# Patient Record
Sex: Male | Born: 2000 | Race: Black or African American | Hispanic: No | Marital: Single | Smoking: Never smoker
Health system: Southern US, Community
[De-identification: ages and names within clinical notes are randomized; demographics above are authoritative.]

## PROBLEM LIST (undated history)

## (undated) DIAGNOSIS — J45909 Unspecified asthma, uncomplicated: Secondary | ICD-10-CM

## (undated) DIAGNOSIS — K56609 Unspecified intestinal obstruction, unspecified as to partial versus complete obstruction: Secondary | ICD-10-CM

## (undated) DIAGNOSIS — S72009A Fracture of unspecified part of neck of unspecified femur, initial encounter for closed fracture: Secondary | ICD-10-CM

## (undated) HISTORY — PX: COLON SURGERY: SHX602

## (undated) HISTORY — PX: APPENDECTOMY: SHX54

## (undated) HISTORY — DX: Unspecified intestinal obstruction, unspecified as to partial versus complete obstruction: K56.609

---
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
## 2019-04-06 ENCOUNTER — Encounter: Payer: Self-pay | Admitting: Emergency Medicine

## 2019-04-06 ENCOUNTER — Ambulatory Visit
Admission: EM | Admit: 2019-04-06 | Discharge: 2019-04-06 | Disposition: A | Discharge status: Home / Self Care | Payer: Medicaid Other | Attending: Family Medicine | Admitting: Family Medicine

## 2019-04-06 ENCOUNTER — Other Ambulatory Visit: Payer: Self-pay

## 2019-04-06 DIAGNOSIS — K529 Noninfective gastroenteritis and colitis, unspecified: Secondary | ICD-10-CM

## 2019-04-06 MED ORDER — ONDANSETRON 8 MG PO TBDP
8.0000 mg | ORAL_TABLET | Freq: Once | ORAL | Status: AC
  Administered 2019-04-06: 8 mg via ORAL

## 2019-04-06 MED ORDER — KETOROLAC TROMETHAMINE 30 MG/ML IJ SOLN
30.0000 mg | Freq: Once | INTRAMUSCULAR | Status: AC
  Administered 2019-04-06: 13:00:00 30 mg via INTRAMUSCULAR

## 2019-04-06 MED ORDER — ONDANSETRON 8 MG PO TBDP: 8.0000 mg | ORAL_TABLET | Freq: Three times a day (TID) | ORAL | 0 refills | Status: DC | PRN

## 2019-04-06 NOTE — ED Provider Notes (Signed)
[facility]    CSN: [id number] Arrival date & time: 04/06/19  1223     History   Chief Complaint Chief Complaint  Patient presents with  . Abdominal Pain  . Emesis    HPI Allen Mays is a 18 y.o. male.   18 yo male with a c/o stomach pain last night then vomiting and diarrhea this morning. Denies any fevers, chills, melena, hematochezia, hematemesis. Thinks it may have been something he ate. No known sick contacts. Has had appendectomy years ago.    Abdominal Pain Associated symptoms: vomiting   Emesis Associated symptoms: abdominal pain     Past Medical History:  Diagnosis Date  . Hip fracture (HCC)     There are no active problems to display for this patient.   Past Surgical History:  Procedure Laterality Date  . APPENDECTOMY    . COLON SURGERY         Home Medications    Prior to Admission medications   Medication Sig Start Date End Date Taking? Authorizing Provider  meloxicam (MOBIC) 15 MG tablet meloxicam 15 mg tablet  TK 1 T PO QD    [provider]  ondansetron (ZOFRAN ODT) 8 MG disintegrating tablet Take 1 tablet (8 mg total) by mouth every 8 (eight) hours as needed. 04/06/19   Payton Mccallumonty, Keiasha Diep, MD  ondansetron (ZOFRAN) 4 MG tablet Take 1 tablet (4 mg total) by mouth every 6 (six) hours as needed for nausea or vomiting. 03/20/18   Georgetta HaberBurky, Natalie B, NP    Family History History reviewed. No pertinent family history.  Social History Social History   Tobacco Use  . Smoking status: Never Smoker  . Smokeless tobacco: Never Used  Substance Use Topics  . Alcohol use: No  . Drug use: No     Allergies   Patient has no known allergies.   Review of Systems Review of Systems  Gastrointestinal: Positive for abdominal pain and vomiting.     Physical Exam Triage Vital Signs ED Triage Vitals  Enc Vitals Group     BP 04/06/19 1246 (!) 150/74     Pulse Rate 04/06/19 1246 75     Resp 04/06/19 1246 16     Temp 04/06/19  1246 97.7 F (36.5 C)     Temp Source 04/06/19 1246 Oral     SpO2 04/06/19 1246 100 %     Weight 04/06/19 1243 165 lb (74.8 kg)     Height 04/06/19 1243 5\' 11"  (1.803 m)     Head Circumference --      Peak Flow --      Pain Score 04/06/19 1243 8     Pain Loc --      Pain Edu? --      Excl. in GC? --    No data found.  Updated Vital Signs BP (!) 150/74 (BP Location: Left Arm)   Pulse 75   Temp 97.7 F (36.5 C) (Oral)   Resp 16   Ht 5\' 11"  (1.803 m)   Wt 74.8 kg   SpO2 100%   BMI 23.01 kg/m   Visual Acuity Right Eye Distance:   Left Eye Distance:   Bilateral Distance:    Right Eye Near:   Left Eye Near:    Bilateral Near:     Physical Exam Vitals signs and nursing note reviewed.  Constitutional:      General: He is not in acute distress.    Appearance: He is not toxic-appearing or diaphoretic.  Abdominal:     General: Bowel sounds are normal. There is no distension.     Palpations: Abdomen is soft. There is no mass.     Tenderness: There is no abdominal tenderness. There is no right CVA tenderness, left CVA tenderness, guarding or rebound.     Hernia: No hernia is present.  Neurological:     Mental Status: He is alert.      UC Treatments / Results  Labs (all labs ordered are listed, but only abnormal results are displayed) Labs Reviewed - No data to display  EKG   Radiology No results found.  Procedures Procedures (including critical care time)  Medications Ordered in UC Medications  ondansetron (ZOFRAN-ODT) disintegrating tablet 8 mg (8 mg Oral Given 04/06/19 1249)  ketorolac (TORADOL) 30 MG/ML injection 30 mg (30 mg Intramuscular Given 04/06/19 1313)    Initial Impression / Assessment and Plan / UC Course  I have reviewed the triage vital signs and the nursing notes.  Pertinent labs & imaging results that were available during my care of the patient were reviewed by me and considered in my medical decision making (see chart for details).       Final Clinical Impressions(s) / UC Diagnoses   Final diagnoses:  Gastroenteritis     Discharge Instructions     Rest, fluids    ED Prescriptions    Medication Sig Dispense Auth. Provider   ondansetron (ZOFRAN ODT) 8 MG disintegrating tablet Take 1 tablet (8 mg total) by mouth every 8 (eight) hours as needed. 6 tablet Norval Gable, MD      1.diagnosis reviewed with patient; patient given 8mg  zofran odt with improvement of symptoms; tolerating po fluids 2. rx as per orders above; reviewed possible side effects, interactions, risks and benefits  3. Recommend supportive treatment as above 4. Follow-up prn if symptoms worsen or don't improve   Controlled Substance Prescriptions [place] Controlled Substance Registry consulted? Not Applicable   Norval Gable, MD 04/06/19 1426

## 2019-04-06 NOTE — ED Triage Notes (Signed)
Patient c/ostomach pain last night.  Patient states that he started vomiting this morning. Patient denies fevers.

## 2019-04-06 NOTE — Discharge Instructions (Addendum)
Rest, fluids. 

## 2020-01-26 ENCOUNTER — Other Ambulatory Visit: Payer: Self-pay

## 2020-01-26 ENCOUNTER — Ambulatory Visit
Admission: EM | Admit: 2020-01-26 | Discharge: 2020-01-26 | Disposition: A | Discharge status: Other Acute Inpatient Hospital | Payer: Medicaid Other | Attending: Family Medicine | Admitting: Family Medicine

## 2020-01-26 ENCOUNTER — Encounter: Payer: Self-pay | Admitting: Emergency Medicine

## 2020-01-26 ENCOUNTER — Ambulatory Visit: Payer: Medicaid Other

## 2020-01-26 DIAGNOSIS — K567 Ileus, unspecified: Secondary | ICD-10-CM | POA: Diagnosis present

## 2020-01-26 HISTORY — DX: Unspecified asthma, uncomplicated: J45.909

## 2020-01-26 LAB — COMPREHENSIVE METABOLIC PANEL
ALT: 25 U/L (ref 0–44)
AST: 32 U/L (ref 15–41)
Albumin: 5 g/dL (ref 3.5–5.0)
Alkaline Phosphatase: 60 U/L (ref 38–126)
Anion gap: 10 (ref 5–15)
BUN: 13 mg/dL (ref 6–20)
CO2: 24 mmol/L (ref 22–32)
Calcium: 9.6 mg/dL (ref 8.9–10.3)
Chloride: 101 mmol/L (ref 98–111)
Creatinine, Ser: 1.12 mg/dL (ref 0.61–1.24)
GFR calc Af Amer: >60 mL/min (ref >60)
GFR calc non Af Amer: >60 mL/min (ref >60)
Glucose, Bld: 118 mg/dL — ABNORMAL HIGH (ref 70–99)
Potassium: 3.9 mmol/L (ref 3.5–5.1)
Sodium: 135 mmol/L (ref 135–145)
Total Bilirubin: 1.3 mg/dL — ABNORMAL HIGH (ref 0.3–1.2)
Total Protein: 8.6 g/dL — ABNORMAL HIGH (ref 6.5–8.1)

## 2020-01-26 LAB — CBC WITH DIFFERENTIAL/PLATELET
Abs Immature Granulocytes: 0.03 10*3/uL (ref 0.00–0.07)
Basophils Absolute: 0 10*3/uL (ref 0.0–0.1)
Basophils Relative: 0 %
Eosinophils Absolute: 0 10*3/uL (ref 0.0–0.5)
Eosinophils Relative: 0 %
HCT: 43.4 % (ref 39.0–52.0)
Hemoglobin: 13.8 g/dL (ref 13.0–17.0)
Immature Granulocytes: 0 %
Lymphocytes Relative: 14 %
Lymphs Abs: 1.1 10*3/uL (ref 0.7–4.0)
MCH: 24 pg — ABNORMAL LOW (ref 26.0–34.0)
MCHC: 31.8 g/dL (ref 30.0–36.0)
MCV: 75.5 fL — ABNORMAL LOW (ref 80.0–100.0)
Monocytes Absolute: 0.6 10*3/uL (ref 0.1–1.0)
Monocytes Relative: 7 %
Neutro Abs: 6.2 10*3/uL (ref 1.7–7.7)
Neutrophils Relative %: 79 %
Platelets: 266 10*3/uL (ref 150–400)
RBC: 5.75 MIL/uL (ref 4.22–5.81)
RDW: 13.2 % (ref 11.5–15.5)
WBC: 8 10*3/uL (ref 4.0–10.5)
nRBC: 0 % (ref 0.0–0.2)

## 2020-01-26 LAB — LIPASE, BLOOD: Lipase: 29 U/L (ref 11–51)

## 2020-01-26 MED ORDER — SODIUM CHLORIDE 0.9 % IV BOLUS
1000.0000 mL | Freq: Once | INTRAVENOUS | Status: AC
  Administered 2020-01-26: 10:00:00 1000 mL via INTRAVENOUS

## 2020-01-26 MED ORDER — KETOROLAC TROMETHAMINE 30 MG/ML IJ SOLN
30.0000 mg | Freq: Once | INTRAMUSCULAR | Status: AC
  Administered 2020-01-26: 10:00:00 30 mg via INTRAVENOUS

## 2020-01-26 MED ORDER — IOHEXOL 300 MG/ML  SOLN
100.0000 mL | Freq: Once | INTRAMUSCULAR | Status: AC | PRN
  Administered 2020-01-26: 11:00:00 100 mL via INTRAVENOUS

## 2020-01-26 MED ORDER — ONDANSETRON HCL 4 MG/2ML IJ SOLN
4.0000 mg | Freq: Once | INTRAMUSCULAR | Status: AC
Start: 2020-01-26 — End: 2020-01-26
  Administered 2020-01-26: 10:00:00 4 mg via INTRAVENOUS

## 2020-01-26 NOTE — ED Provider Notes (Signed)
[facility]    CSN: [id number] Arrival date & time: 01/26/20  0859  History   Chief Complaint Chief Complaint  Patient presents with  . Abdominal Pain   HPI  19 year old male with a prior history of perforated appendicitis and possible Crohn's disease which subsequently led to an ileostomy presents with abdominal pain, nausea, vomiting.  Patient reports that around 8 PM last night he developed severe lower abdominal pain and associated back pain.  Subsequently developed nausea and vomiting.  He has had several episodes of emesis.  Denies fever.  Denies diarrhea.  Patient states that he had a similar bout of the same symptoms approximately 2 weeks ago.  He reports decreased urine output.  Patient believes that he is dehydrated.  Rates his pain is 8/10 in severity.  Located in the lower abdomen and radiates to the back.  No relieving factors.  No other associated symptoms.  No other complaints.  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 Authorizing Provider  Lactobacillus (PROBIOTIC ACIDOPHILUS) CAPS  12/20/19  Yes [provider]    Family History Family History  Problem Relation Age of Onset  . Healthy Mother     Social History Social History   Tobacco Use  . Smoking status: Never Smoker  . Smokeless tobacco: Never Used  Substance Use Topics  . Alcohol use: No  . Drug use: No     Allergies   Patient has no known allergies.   Review of Systems Review of Systems  Constitutional: Positive for appetite change.  Negative for fever.  Gastrointestinal: Positive for abdominal pain, nausea and vomiting.   Physical Exam Triage Vital Signs ED Triage Vitals  Enc Vitals Group     BP 01/26/20 0919 138/65     Pulse Rate 01/26/20 0919 79     Resp 01/26/20 0919 20     Temp 01/26/20 0919 98.2 F (36.8 C)     Temp Source 01/26/20 0919 Oral     SpO2 01/26/20 0919 100 %     Weight 01/26/20 0915 164 lb 14.5 oz (74.8 kg)     Height 01/26/20 0915 5\' 11"  (1.803 m)     Head Circumference --      Peak Flow --      Pain Score 01/26/20 0914 8     Pain Loc --      Pain Edu? --      Excl. in GC? --    Updated Vital Signs BP 138/65 (BP Location: Right Arm)   Pulse 79   Temp 98.2 F (36.8 C) (Oral)   Resp 20   Ht 5\' 11"  (1.803 m)   Wt 74.8 kg   SpO2 100%   BMI 23.00 kg/m   Visual Acuity Right Eye Distance:   Left Eye Distance:   Bilateral Distance:    Right Eye Near:   Left Eye Near:    Bilateral Near:     Physical Exam Vitals and nursing note reviewed.  Constitutional:      General: He is not in acute distress.    Appearance:  He is well-developed. He is not ill-appearing.  HENT:     Head: Normocephalic and atraumatic.  Eyes:     General:        Right eye: No discharge.        Left eye: No discharge.     Conjunctiva/sclera: Conjunctivae normal.  Cardiovascular:     Rate and Rhythm: Normal rate and regular rhythm.     Heart sounds: No murmur.  Pulmonary:     Effort: Pulmonary effort is normal.     Breath sounds: Normal breath sounds. No wheezing or rales.  Abdominal:     Comments: Soft, nondistended.  No discrete tenderness on exam.  Neurological:     Mental Status: He is alert.  Psychiatric:        Mood and Affect: Mood normal.        Behavior: Behavior normal.    UC Treatments / Results  Labs (all labs ordered are listed, but only abnormal results are displayed) Labs Reviewed  CBC WITH DIFFERENTIAL/PLATELET - Abnormal; Notable for the following components:      Result Value     MCV 75.5 (*)    MCH 24.0 (*)    All other components within normal limits  COMPREHENSIVE METABOLIC PANEL  LIPASE, BLOOD    EKG   Radiology CT ABDOMEN PELVIS W CONTRAST  Result Date: 01/26/2020 CLINICAL DATA:  Abdominal pain with vomiting EXAM: CT ABDOMEN AND PELVIS WITH CONTRAST TECHNIQUE: Multidetector CT imaging of the abdomen and pelvis was performed using the standard protocol following bolus administration of intravenous contrast. CONTRAST:  198mL OMNIPAQUE IOHEXOL 300 MG/ML  SOLN COMPARISON:  None. FINDINGS: Lower chest: On axial slice 1 series 6, there is a 2 mm nodular opacity in the periphery of the lateral segment of the right lower lobe. Lung bases otherwise are clear. Hepatobiliary: No focal liver lesions are appreciable. Gallbladder wall is not appreciably thickened. There is no biliary duct dilatation. Pancreas: No pancreatic mass or inflammatory focus. Spleen: No splenic lesions are evident. Adrenals/Urinary Tract: Adrenals bilaterally appear normal. Kidneys bilaterally show no evident mass or hydronephrosis on either side. Urinary bladder is midline with wall thickness within normal limits. Stomach/Bowel: There are postoperative changes in the right lateral abdomen in the region of the proximal ascending colon. Anastomosis in this area is patent. Much of the colon is mildly dilated with apparent liquid stool throughout most of the colon. There is no appreciable colonic wall thickening or focal area of colonic obstruction. There is no appreciable small bowel wall thickening or evidence of small-bowel obstruction. No free air or portal venous air evident. Vascular/Lymphatic: No abdominal aortic aneurysm. No arterial vascular lesions are evident. Major venous structures appear patent. There is no evident adenopathy in the abdomen or pelvis. Reproductive: Prostate and seminal vesicles are normal in size and contour. There is no evident pelvic mass. Other: Appendix absent. No  periappendiceal region inflammation. There is no ascites or abscess in the abdomen or pelvis. Musculoskeletal: No blastic or lytic bone lesions. Evidence of old trauma along the left iliac crest with a focal area of nonunion. This area is well corticated. No acute fracture evident. No intramuscular or abdominal wall lesions. IMPRESSION: 1. Postoperative change involving the right colon with patent anastomosis. Appendix absent. There is mild dilatation of much of the transverse, descending, and sigmoid colon regions with a fairly large amount of predominantly liquid stool throughout this area. Question underlying diarrhea. No bowel obstruction or bowel wall thickening. A degree of colonic ileus  is questioned. 2. No evident small bowel obstruction. No small bowel wall thickening appreciable. No evident abscess in the abdomen or pelvis. 3. No renal or ureteral calculus. No hydronephrosis. Urinary bladder wall thickness within normal limits. 4. 2 mm nodular opacity in the right lower lobe. In this age group, this finding is almost certainly benign. Unless this patient has a history of neoplasm, further imaging evaluation of this small nodular opacity is not felt to be warranted. Electronically Signed   By: Bretta Bang III M.D.   On: 01/26/2020 11:16    Procedures Procedures (including critical care time)  Medications Ordered in UC Medications  sodium chloride 0.9 % bolus 1,000 mL (0 mLs Intravenous Stopped 01/26/20 1142)  ondansetron (ZOFRAN) injection 4 mg (4 mg Intravenous Given 01/26/20 0948)  ketorolac (TORADOL) 30 MG/ML injection 30 mg (30 mg Intravenous Given 01/26/20 1020)  iohexol (OMNIPAQUE) 300 MG/ML solution 100 mL (100 mLs Intravenous Contrast Given 01/26/20 1105)    Initial Impression / Assessment and Plan / UC Course  I have reviewed the triage vital signs and the nursing notes.  Pertinent labs & imaging results that were available during my care of the patient were reviewed by me and  considered in my medical decision making (see chart for details).    19 year old male presents with lower abdominal pain, back pain, nausea, and vomiting.  Labs reassuring.  Given his history and presentation, CT abdomen pelvis was obtained.  See findings above.  Concern for ileus.  Patient was given IV fluids and IV Zofran as well as IV Toradol with improvement here.  I have advised the patient and his mother that he needs to go to the hospital for serial monitoring and observation.  They are in agreement.  He is going to [facility].  I have notified the triage nurse at [facility].  Final Clinical Impressions(s) / UC Diagnoses   Final diagnoses:  Ileus [facility])     Discharge Instructions     Go directly to the ER [facility]).  Take care & best of luck  Dr. Adriana Simas     ED Prescriptions    None     PDMP not reviewed this encounter.   Tommie Sams, [place] 01/26/20 1152

## 2020-01-26 NOTE — Discharge Instructions (Addendum)
Go directly to the ER [facility]).  Take care & best of luck  Dr. Adriana Simas

## 2020-01-26 NOTE — ED Triage Notes (Addendum)
Pt c/o mid lower abdominal pain, vomiting, lower back pain. Started yesterday. Denies fever. States he diarrhea yesterday morning but that is not abnormal for him. He states that he has not urinated since last night. He states he is not keeping fluids down. Declines covid testing, states he is tested every week.

## 2020-01-27 DIAGNOSIS — Z9889 Other specified postprocedural states: Secondary | ICD-10-CM | POA: Insufficient documentation

## 2020-01-27 DIAGNOSIS — K56609 Unspecified intestinal obstruction, unspecified as to partial versus complete obstruction: Secondary | ICD-10-CM | POA: Insufficient documentation

## 2020-03-18 ENCOUNTER — Other Ambulatory Visit: Payer: Self-pay

## 2020-03-18 ENCOUNTER — Ambulatory Visit
Admission: EM | Admit: 2020-03-18 | Discharge: 2020-03-18 | Disposition: A | Discharge status: Home / Self Care | Payer: Medicaid Other | Attending: Emergency Medicine | Admitting: Emergency Medicine

## 2020-03-18 ENCOUNTER — Encounter: Payer: Self-pay | Admitting: Emergency Medicine

## 2020-03-18 DIAGNOSIS — Z202 Contact with and (suspected) exposure to infections with a predominantly sexual mode of transmission: Secondary | ICD-10-CM | POA: Diagnosis present

## 2020-03-18 LAB — CHLAMYDIA/NGC RT PCR (ARMC ONLY)
Chlamydia Tr: DETECTED — AB
N gonorrhoeae: NOT DETECTED

## 2020-03-18 MED ORDER — DOXYCYCLINE HYCLATE 100 MG PO CAPS: 100.0000 mg | ORAL_CAPSULE | Freq: Two times a day (BID) | ORAL | 0 refills | Status: AC

## 2020-03-18 MED ORDER — CEFTRIAXONE SODIUM 500 MG IJ SOLR
500.0000 mg | Freq: Once | INTRAMUSCULAR | Status: AC
  Administered 2020-03-18: 11:00:00 500 mg via INTRAMUSCULAR

## 2020-03-18 NOTE — ED Triage Notes (Signed)
Patient received a call from a partner yesterday stating they tested positive for Chlamydia. Patient here today to get STD testing. Patient denies any symptoms at this time.

## 2020-03-18 NOTE — Discharge Instructions (Addendum)
You were seen for possible STD exposure and are being treated for possible gonorrhea and chlamydia.   Take the antibiotics as prescribed until they're finished. If you think you're having a reaction, stop the medication, take benadryl and go to the nearest urgent care/emergency room. Take a probiotic while taking the antibiotic to decrease the chances of stomach upset.   Make sure you either 1) don't have sex for a week or 2) use a condom with sex for at least a week. Condoms with sex is always recommended.  Full screening for STIs at the health department strongly encouraged.  Take care, Dr. Sharlet Salina, NP-c

## 2020-03-18 NOTE — ED Provider Notes (Signed)
[facility] [place] [facility] [place]   Name: Allen Mays DOB: Oct 23, 2000 MRN: [id number] CSN: [id number] PCP: System, Provider Not In  Arrival date and time:  03/18/20 0935  Chief Complaint:  Exposure to STD   NOTE: Prior to seeing the patient today, I have reviewed the triage nursing documentation and vital signs. Clinical staff has updated patient's PMH/PSHx, current medication list, and drug allergies/intolerances to ensure comprehensive history available to assist in medical decision making.   History:   HPI: Allen Mays is a 19 y.o. male who presents today with complaints of possible STI exposure. Patient states she was positive for chlamydia yesterday.  Last time he had unprotected sexual intercourse with this partner was 2 days ago. He has never been tested or treated for STIs in the past.  He currently denies any symptoms, but he is wanting to get tested and received treatment due to his partners positive chlamydia test.   Past Medical History:  Diagnosis Date   Asthma    Hip fracture (HCC)     Past Surgical History:  Procedure Laterality Date   APPENDECTOMY     COLON SURGERY      Family History  Problem Relation Age of Onset   Healthy Mother    Other Father        unknown medical history    Social History   Tobacco Use   Smoking status: Never Smoker   Smokeless tobacco: Never Used  Vaping Use   Vaping Use: Never used  Substance Use Topics   Alcohol use: No   Drug use: No    There are no problems to display for this patient.   Home Medications:    No outpatient medications have been marked as taking for the 03/18/20 encounter [facility] Encounter).    Allergies:   Justicia adhatoda (malabar nut tree) [justicia adhatoda], Eggs or egg-derived products, Lac bovis, and Lactase  Review of Systems (ROS): Review of Systems  Genitourinary: Negative for dysuria, penile pain, penile swelling, scrotal swelling and testicular pain.  All other  systems reviewed and are negative.    Vital Signs: Today's Vitals   03/18/20 0952 03/18/20 0958 03/18/20 1047  BP: 122/74    Pulse: 73    Resp: 16    Temp: 98.8 F (37.1 C)    TempSrc: Oral    SpO2: 99%    Weight:  166 lb (75.3 kg)   Height:  5\' 11"  (1.803 m)   PainSc:  0-No pain 0-No pain    Physical Exam: Physical Exam Vitals and nursing note reviewed.  Constitutional:      Appearance: Normal appearance.  Cardiovascular:     Rate and Rhythm: Normal rate and regular rhythm.     Pulses: Normal pulses.     Heart sounds: Normal heart sounds.  Pulmonary:     Effort: Pulmonary effort is normal.     Breath sounds: Normal breath sounds.  Skin:    General: Skin is warm and dry.  Neurological:     General: No focal deficit present.     Mental Status: He is alert and oriented to person, place, and time.  Psychiatric:        Mood and Affect: Mood normal.        Behavior: Behavior normal.      Urgent Care Treatments / Results:   LABS: PLEASE NOTE: all labs that were ordered this encounter are listed, however only abnormal results are displayed. Labs Reviewed  CHLAMYDIA/NGC RT PCR (  [facility] ONLY)    EKG: -None  RADIOLOGY: No results found.  PROCEDURES: Procedures  MEDICATIONS RECEIVED THIS VISIT: Medications  cefTRIAXone (ROCEPHIN) injection 500 mg (500 mg Intramuscular Given 03/18/20 1044)    PERTINENT CLINICAL COURSE NOTES/UPDATES:   Initial Impression / Assessment and Plan / Urgent Care Course:  Pertinent labs & imaging results that were available during my care of the patient were personally reviewed by me and considered in my medical decision making (see lab/imaging section of note for values and interpretations).  Allen Mays is a 19 y.o. male who presents to [facility] today with complaints of STI exposure, diagnosed with the same, and treated as such with the medications below. NP and patient reviewed discharge instructions below during  visit.   Patient is well appearing overall in clinic today. He does not appear to be in any acute distress. Presenting symptoms (see HPI) and exam as documented above.   I have reviewed the follow up and strict return precautions for any new or worsening symptoms. Patient is aware of symptoms that would be deemed urgent/emergent, and would thus require further evaluation either here or in the emergency department. At the time of discharge, he verbalized understanding and consent with the discharge plan as it was reviewed with him. All questions were fielded by provider and/or clinic staff prior to patient discharge.    Final Clinical Impressions / Urgent Care Diagnoses:   Final diagnoses:  STD exposure    New Prescriptions:  [place] Controlled Substance Registry consulted? Not Applicable  Meds ordered this encounter  Medications   cefTRIAXone (ROCEPHIN) injection 500 mg   doxycycline (VIBRAMYCIN) 100 MG capsule    Sig: Take 1 capsule (100 mg total) by mouth 2 (two) times daily for 7 days.    Dispense:  14 capsule    Refill:  0      Discharge Instructions     You were seen for possible STD exposure and are being treated for possible gonorrhea and chlamydia.   Take the antibiotics as prescribed until they're finished. If you think you're having a reaction, stop the medication, take benadryl and go to the nearest urgent care/emergency room. Take a probiotic while taking the antibiotic to decrease the chances of stomach upset.   Make sure you either 1) don't have sex for a week or 2) use a condom with sex for at least a week. Condoms with sex is always recommended.  Full screening for STIs at the health department strongly encouraged.  Take care, Dr. Marland Kitchen, NP-c     Recommended Follow up Care:  Patient encouraged to follow up with the following provider within the specified time frame, or sooner as dictated by the severity of his symptoms. As always, he was instructed that for  any urgent/emergent care needs, he should seek care either here or in the emergency department for more immediate evaluation.   Gertie Baron, DNP, NP-c    Gertie Baron, NP 03/18/20 1102

## 2020-03-28 ENCOUNTER — Encounter: Payer: Self-pay | Admitting: Emergency Medicine

## 2020-03-28 ENCOUNTER — Emergency Department: Payer: Medicaid Other

## 2020-03-28 ENCOUNTER — Other Ambulatory Visit: Payer: Self-pay

## 2020-03-28 ENCOUNTER — Ambulatory Visit
Admission: EM | Admit: 2020-03-28 | Discharge: 2020-03-28 | Disposition: A | Discharge status: Home / Self Care | Payer: Medicaid Other | Attending: Nurse Practitioner | Admitting: Nurse Practitioner

## 2020-03-28 DIAGNOSIS — R12 Heartburn: Secondary | ICD-10-CM

## 2020-03-28 DIAGNOSIS — R9431 Abnormal electrocardiogram [ECG] [EKG]: Secondary | ICD-10-CM | POA: Diagnosis not present

## 2020-03-28 DIAGNOSIS — J45909 Unspecified asthma, uncomplicated: Secondary | ICD-10-CM | POA: Diagnosis not present

## 2020-03-28 DIAGNOSIS — R0789 Other chest pain: Secondary | ICD-10-CM | POA: Insufficient documentation

## 2020-03-28 LAB — BASIC METABOLIC PANEL
Anion gap: 10 (ref 5–15)
BUN: 14 mg/dL (ref 6–20)
CO2: 28 mmol/L (ref 22–32)
Calcium: 9.4 mg/dL (ref 8.9–10.3)
Chloride: 101 mmol/L (ref 98–111)
Creatinine, Ser: 1.09 mg/dL (ref 0.61–1.24)
GFR calc Af Amer: >60 mL/min (ref >60)
GFR calc non Af Amer: >60 mL/min (ref >60)
Glucose, Bld: 85 mg/dL (ref 70–99)
Potassium: 3.9 mmol/L (ref 3.5–5.1)
Sodium: 139 mmol/L (ref 135–145)

## 2020-03-28 LAB — CBC
HCT: 42.1 % (ref 39.0–52.0)
Hemoglobin: 13.6 g/dL (ref 13.0–17.0)
MCH: 24 pg — ABNORMAL LOW (ref 26.0–34.0)
MCHC: 32.3 g/dL (ref 30.0–36.0)
MCV: 74.4 fL — ABNORMAL LOW (ref 80.0–100.0)
Platelets: 190 10*3/uL (ref 150–400)
RBC: 5.66 MIL/uL (ref 4.22–5.81)
RDW: 14.2 % (ref 11.5–15.5)
WBC: 5.3 10*3/uL (ref 4.0–10.5)
nRBC: 0 % (ref 0.0–0.2)

## 2020-03-28 LAB — TROPONIN I (HIGH SENSITIVITY): Troponin I (High Sensitivity): 3 ng/L (ref <18)

## 2020-03-28 MED ORDER — ASPIRIN 81 MG PO CHEW
324.0000 mg | CHEWABLE_TABLET | Freq: Once | ORAL | Status: AC
  Administered 2020-03-28: 20:00:00 324 mg via ORAL

## 2020-03-28 NOTE — ED Provider Notes (Signed)
[facility]    CSN: [id number] Arrival date & time: 03/28/20  1713      History   Chief Complaint Chief Complaint  Patient presents with  . Heartburn    HPI Allen Mays is a 19 y.o. male.   Subjective:   Allen Mays is an 19 y.o. male who presents for evaluation of heartburn. This has been associated with belching. Symptoms primarily relate to meals and lying down after meals but has occurred during fasting states. He denies any abdominal bloating, bilious reflux, chest pain, choking on food, cough, difficulty swallowing, dysphagia, early satiety, fullness after meals, hematemesis, hoarseness, melena, midespigastric pain, nausea, need to clear throat frequently, regurgitation of undigested food, shortness of breath, unexpected weight loss, upper abdominal discomfort or wheezing. Symptoms have been present for 3 days. He denies dysphagia. He has not lost weight. He denies melena, hematochezia, hematemesis, and coffee ground emesis.  He has tried Pepto-Bismol and another reflux medication given by a friend with no improvement in his symptoms. He has no prior history of heartburn. He denies any known heart disease in self or family.   The following portions of the patient's history were reviewed and updated as appropriate: allergies, current medications, past family history, past medical history, past social history, past surgical history and problem list.       Past Medical History:  Diagnosis Date  . Asthma   . Hip fracture (HCC)     There are no problems to display for this patient.   Past Surgical History:  Procedure Laterality Date  . APPENDECTOMY    . COLON SURGERY         Home Medications    Prior to Admission medications   Medication Sig Start Date End Date Taking? Authorizing Provider  Lactobacillus (PROBIOTIC ACIDOPHILUS) CAPS  12/20/19   [provider]    Family History Family History  Problem Relation Age of Onset  . Healthy  Mother   . Other Father        unknown medical history    Social History Social History   Tobacco Use  . Smoking status: Never Smoker  . Smokeless tobacco: Never Used  Vaping Use  . Vaping Use: Never used  Substance Use Topics  . Alcohol use: No  . Drug use: No     Allergies   Justicia adhatoda (malabar nut tree) [justicia adhatoda], Eggs or egg-derived products, Lac bovis, and Lactase   Review of Systems Review of Systems  Constitutional: Negative for appetite change.  Respiratory: Negative for cough, choking, shortness of breath and wheezing.   Cardiovascular: Negative for chest pain.  Gastrointestinal: Negative for abdominal distention, abdominal pain, blood in stool, constipation, diarrhea, nausea and vomiting.  All other systems reviewed and are negative.    Physical Exam Triage Vital Signs ED Triage Vitals  Enc Vitals Group     BP 03/28/20 1749 127/62     Pulse Rate 03/28/20 1749 60     Resp 03/28/20 1749 18     Temp 03/28/20 1749 98.4 F (36.9 C)     Temp Source 03/28/20 1749 Oral     SpO2 03/28/20 1749 100 %     Weight 03/28/20 1748 165 lb (74.8 kg)     Height 03/28/20 1748 5\' 11"  (1.803 m)     Head Circumference --      Peak Flow --      Pain Score 03/28/20 1747 3     Pain Loc --  Pain Edu? --      Excl. in GC? --    No data found.  Updated Vital Signs BP 127/62 (BP Location: Left Arm)   Pulse 60   Temp 98.4 F (36.9 C) (Oral)   Resp 18   Ht 5\' 11"  (1.803 m)   Wt 165 lb (74.8 kg)   SpO2 100%   BMI 23.01 kg/m   Visual Acuity Right Eye Distance:   Left Eye Distance:   Bilateral Distance:    Right Eye Near:   Left Eye Near:    Bilateral Near:     Physical Exam Vitals and nursing note reviewed.  Constitutional:      General: He is not in acute distress.    Appearance: Normal appearance. He is not ill-appearing, toxic-appearing or diaphoretic.  HENT:     Head: Normocephalic.     Mouth/Throat:     Lips: Pink.     Mouth:  Mucous membranes are moist.     Pharynx: Oropharynx is clear. No pharyngeal swelling or posterior oropharyngeal erythema.  Cardiovascular:     Rate and Rhythm: Normal rate and regular rhythm.     Heart sounds: Murmur heard.   Pulmonary:     Effort: Pulmonary effort is normal.     Breath sounds: Normal breath sounds.  Abdominal:     General: There is no distension.     Palpations: Abdomen is soft.     Tenderness: There is no abdominal tenderness.  Musculoskeletal:        General: Normal range of motion.     Cervical back: Normal range of motion and neck supple.  Skin:    General: Skin is warm and dry.  Neurological:     General: No focal deficit present.     Mental Status: He is alert and oriented to person, place, and time.      UC Treatments / Results  Labs (all labs ordered are listed, but only abnormal results are displayed) Labs Reviewed - No data to display  EKG   Radiology No results found.  Procedures ED EKG  Date/Time: 03/28/2020 7:35 PM Performed by: 05/29/2020, FNP Authorized by: Lurline Idol, FNP   ECG reviewed by ED Physician in the absence of a cardiologist: yes   Previous ECG:    Previous ECG:  Unavailable Interpretation:    Interpretation: abnormal   Rate:    ECG rate:  53   ECG rate assessment: bradycardic   Rhythm:    Rhythm: sinus bradycardia   ST segments:    ST segments:  Abnormal   Elevation:  V2, V3, V4, V5 and V6   (including critical care time)  Medications Ordered in UC Medications  aspirin chewable tablet 324 mg (324 mg Oral Given 03/28/20 1931)    Initial Impression / Assessment and Plan / UC Course  I have reviewed the triage vital signs and the nursing notes.  Pertinent labs & imaging results that were available during my care of the patient were reviewed by me and considered in my medical decision making (see chart for details).    19 year old male with history of asthma presenting with a 3-day history of  possible heartburn.  Patient has tried to couple of over-the-counter agents for his symptoms without any relief in his symptoms.  He denies any chest pain, shortness of breath or wheezing.  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  He is very athletic and in shape.  EKG shows sinus  bradycardia with ST elevations in V2, V3, V4, V5 and V6.  No acute STEMI appreciated; however, this EKG is grossly abnormal.  Patient was given aspirin 324 mg p.o. EMS has been called for emergent ED evaluation.  Patient will definitely need cardiology consultation as well.  Patient is alert and oriented x3.  Afebrile.  Vital signs stable.  Nontoxic-appearing.  Discussed diagnosis with patient as well as his mother over the telephone.  Both patient and his mother is comfortable with the plan and disposition.  Copy of today's note as well as EKG given to EMS.  Report given to EMS at their arrival to the clinic.  This care was provided during an unprecedented National Emergency due to the Novel Coronavirus (COVID-19) pandemic. COVID-19 infections and transmission risks place heavy strains on healthcare resources.  As this pandemic evolves, our facility, providers, and staff strive to respond fluidly, to remain operational, and to provide care relative to available resources and information. Outcomes are unpredictable and treatments are without well-defined guidelines. Further, the impact of COVID-19 on all aspects of urgent care, including the impact to patients seeking care for reasons other than COVID-19, is unavoidable during this national emergency. At this time of the global pandemic, management of patients has significantly changed, even for non-COVID positive patients given high local and regional COVID volumes at this time requiring high healthcare system and resource utilization. The standard of care for management of both COVID suspected and non-COVID suspected patients continues to change rapidly at the local,  regional, national, and global levels. This patient was worked up and treated to the best available but ever changing evidence and resources available at this current time.   Documentation was completed with the aid of voice recognition software. Transcription may contain typographical errors.  Final Clinical Impressions(s) / UC Diagnoses   Final diagnoses:  Nonspecific abnormal electrocardiogram (ECG) (EKG)     Discharge Instructions     You are evaluated today for possible heartburn; however, in your EKG is very abnormal.  Considering that you are very healthy and athletic with no medical problems, you need urgent urgency room evaluation cardiology consultation.  You have been given aspirin and you will be transported to the emergency department via ambulance.  Have spoken to your mother over the phone per your request and updated her on your condition.  Please let her know his hospital you will be transported to.    ED Prescriptions    None     PDMP not reviewed this encounter.   Lurline Idol, [place] 03/28/20 1939

## 2020-03-28 NOTE — ED Notes (Signed)
Patient is being discharged from the Urgent Care and sent to the Emergency Department via EMS . Per Lelon Mast, NP, patient is in need of higher level of care due to chest pain and abnormal EKG. Patient is aware and verbalizes understanding of plan of care.  Vitals:   03/28/20 1749  BP: 127/62  Pulse: 60  Resp: 18  Temp: 98.4 F (36.9 C)  SpO2: 100%  \

## 2020-03-28 NOTE — ED Triage Notes (Signed)
Patient states that he has been having heart burn since Saturday and has been unable to get any relief from the Pepto-Bismol. States that it worsens after he eats and when he wakes up in the morning.

## 2020-03-28 NOTE — ED Triage Notes (Signed)
Pt arrived via EMS from UC where he was being evaluated for heart burn symptoms since Saturday. Pt sent to ED due to abnormal EKG reading. Pt denies SOB and chest pain but reports burning after eating.

## 2020-03-28 NOTE — Discharge Instructions (Signed)
You are evaluated today for possible heartburn; however, in your EKG is very abnormal.  Considering that you are very healthy and athletic with no medical problems, you need urgent urgency room evaluation cardiology consultation.  You have been given aspirin and you will be transported to the emergency department via ambulance.  Have spoken to your mother over the phone per your request and updated her on your condition.  Please let her know his hospital you will be transported to.

## 2020-03-29 ENCOUNTER — Emergency Department
Admission: EM | Admit: 2020-03-29 | Discharge: 2020-03-29 | Disposition: A | Discharge status: Home / Self Care | Payer: Medicaid Other | Attending: Emergency Medicine | Admitting: Emergency Medicine

## 2020-03-29 DIAGNOSIS — R0789 Other chest pain: Secondary | ICD-10-CM

## 2020-03-29 MED ORDER — ALUM & MAG HYDROXIDE-SIMETH 200-200-20 MG/5ML PO SUSP: 30.0000 mL | Freq: Once | ORAL | Status: DC

## 2020-03-29 MED ORDER — LIDOCAINE VISCOUS HCL 2 % MT SOLN: 15.0000 mL | Freq: Once | OROMUCOSAL | Status: DC

## 2020-03-29 NOTE — Discharge Instructions (Signed)
I suspect your chest pain is from acid reflux. Take the following OVER THE COUNTER meds:  -- Omeprazole once daily in the mornings for 14 days -- Pepcid twice a day as needed  YOUR EKG TODAY SHOWED BENIGN REPOLARIZATION, A COMMON FINDING. THAT BEING SAID, I'D RECOMMEND REPEATING AN EKG WITH YOUR ATHLETIC TRAINER PRIOR TO RETURNING TO FULL PRACTICE/CARDIO PRACTICE, FOR CLEARANCE

## 2020-03-29 NOTE — ED Provider Notes (Signed)
[facility] Emergency Department Provider Note  ____________________________________________   First MD Initiated Contact with Patient 03/29/20 0023     (approximate)  I have reviewed the triage vital signs and the nursing notes.   HISTORY  Chief Complaint Chest Pain    HPI Allen Mays is a 19 y.o. male  Here with atypical chest pain. Pt reports that over the past week, he's had increasing feeling of burning type discomfort in his substernal area, radiating up towards his throat. THis is often associated with eating and has improved with GI meds. He states he has had some worsening w/ lying flat as well. No SOB, lightheadedness, or dizziness. Pt went to UC to be evaluated today and was sent here for possible abnl EKG. He has never been told he has an abnormal EKG in the past. Denies any known family h/o SCD or early heart disease. 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        Past Medical History:  Diagnosis Date  . Asthma   . Hip fracture (HCC)     There are no problems to display for this patient.   Past Surgical History:  Procedure Laterality Date  . APPENDECTOMY    . COLON SURGERY      Prior to Admission medications   Medication Sig Start Date End Date Taking? Authorizing Provider  Lactobacillus (PROBIOTIC ACIDOPHILUS) CAPS  12/20/19   [provider]    Allergies Justicia adhatoda (malabar nut tree) [justicia adhatoda], Eggs or egg-derived products, Lac bovis, and Lactase  Family History  Problem Relation Age of Onset  . Healthy Mother   . Other Father        unknown medical history    Social History Social History   Tobacco Use  . Smoking status: Never Smoker  . Smokeless tobacco: Never Used  Vaping Use  . Vaping Use: Never used  Substance Use Topics  . Alcohol use: No  . Drug use: No    Review of Systems  Review of Systems  Constitutional: Negative for chills,  fatigue and fever.  HENT: Negative for sore throat.   Respiratory: Negative for shortness of breath.   Cardiovascular: Positive for chest pain.  Gastrointestinal: Positive for nausea. Negative for abdominal pain.  Genitourinary: Negative for flank pain.  Musculoskeletal: Negative for neck pain.  Skin: Negative for rash and wound.  Allergic/Immunologic: Negative for immunocompromised state.  Neurological: Negative for weakness and numbness.  Hematological: Does not bruise/bleed easily.  All other systems reviewed and are negative.    ____________________________________________  PHYSICAL EXAM:      VITAL SIGNS: ED Triage Vitals  Enc Vitals Group     BP 03/28/20 2028 140/80     Pulse Rate 03/28/20 2028 (!) 54     Resp 03/28/20 2028 18     Temp 03/28/20 2028 98 F (36.7 C)     Temp Source 03/28/20 2028 Oral     SpO2 03/28/20 2028 100 %     Weight 03/28/20 2028 165 lb (74.8 kg)     Height 03/28/20 2028 5\' 11"  (1.803 m)     Head Circumference --      Peak Flow --      Pain Score 03/29/20 0001 0     Pain Loc --      Pain Edu? --      Excl. in GC? --      Physical Exam Vitals and  nursing note reviewed.  Constitutional:      General: He is not in acute distress.    Appearance: He is well-developed.  HENT:     Head: Normocephalic and atraumatic.  Eyes:     Conjunctiva/sclera: Conjunctivae normal.  Cardiovascular:     Rate and Rhythm: Normal rate and regular rhythm.     Heart sounds: Normal heart sounds.  Pulmonary:     Effort: Pulmonary effort is normal. No respiratory distress.     Breath sounds: No wheezing.  Abdominal:     General: There is no distension.  Musculoskeletal:     Cervical back: Neck supple.  Skin:    General: Skin is warm.     Capillary Refill: Capillary refill takes less than 2 seconds.     Findings: No rash.  Neurological:     Mental Status: He is alert and oriented to person, place, and time.     Motor: No abnormal muscle tone.        ____________________________________________   LABS (all labs ordered are listed, but only abnormal results are displayed)  Labs Reviewed  CBC - Abnormal; Notable for the following components:      Result Value   MCV 74.4 (*)    MCH 24.0 (*)    All other components within normal limits  BASIC METABOLIC PANEL  TROPONIN I (HIGH SENSITIVITY)    ____________________________________________  EKG: Normal sinus rhythm, VR 56. PR 128, QRS 94, QTc 443. BER with J point elevation most notable in precordial leads, with likely physiologic u waves noted. No reciprocal changes.  ________________________________________  RADIOLOGY All imaging, including plain films, CT scans, and ultrasounds, independently reviewed by me, and interpretations confirmed via formal radiology reads.  ED MD interpretation:   CXR: Clear, no cardiomegaly  Official radiology report(s): DG Chest 2 View  Result Date: 03/28/2020 CLINICAL DATA:  Epigastric pain. EXAM: CHEST - 2 VIEW COMPARISON:  None. FINDINGS: The heart size and mediastinal contours are within normal limits. Both lungs are clear. The visualized skeletal structures are unremarkable. IMPRESSION: No active cardiopulmonary disease. Electronically Signed   By: Aram Candela M.D.   On: 03/28/2020 20:55    ____________________________________________  PROCEDURES   Procedure(s) performed (including Critical Care):  Procedures  ____________________________________________  INITIAL IMPRESSION / MDM / ASSESSMENT AND PLAN / ED COURSE  As part of my medical decision making, I reviewed the following data within the electronic MEDICAL RECORD NUMBER Nursing notes reviewed and incorporated, Old chart reviewed, Notes from prior ED visits, and [place] Controlled Substance Database       *Allen Mays was evaluated in Emergency Department on 03/29/2020 for the symptoms described in the history of present illness. He was evaluated in the context of the global  COVID-19 pandemic, which necessitated consideration that the patient might be at risk for infection with the SARS-CoV-2 virus that causes COVID-19. Institutional protocols and algorithms that pertain to the evaluation of patients at risk for COVID-19 are in a state of rapid change based on information released by regulatory bodies including the CDC and federal and state organizations. These policies and algorithms were followed during the patient's care in the ED.  Some ED evaluations and interventions may be delayed as a result of limited staffing during the pandemic.*     Medical Decision Making:  19 yo M here with atypical chest pain and concern for abnl EKG. EKG on my review is more consistent with BER, particularly in this young, healthy, African-American male. He has likely  physiologic u waves that also fit with his young, athletic history. He has no reciprocal changes. No personal or family h/o early heart disease. No recent sx to suggest pericarditis. Trop neg despite constant sx for days and I do not suspect ACS. No signs of HOCM, WPW, long QT on EKG. Suspect his pain is GI related. Will treat as such, recommend OTC antacids. 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  ____________________________________________  FINAL CLINICAL IMPRESSION(S) / ED DIAGNOSES  Final diagnoses:  Atypical chest pain     MEDICATIONS GIVEN DURING THIS VISIT:  Medications  alum & mag hydroxide-simeth (MAALOX/MYLANTA) 200-200-20 MG/5ML suspension 30 mL (has no administration in time range)    And  lidocaine (XYLOCAINE) 2 % viscous mouth solution 15 mL (has no administration in time range)     ED Discharge Orders    None       Note:  This document was prepared using Dragon voice recognition software and may include unintentional dictation errors.   Shaune Pollack, MD 03/29/20 [phone]

## 2021-04-01 ENCOUNTER — Other Ambulatory Visit: Payer: Self-pay

## 2021-04-01 ENCOUNTER — Ambulatory Visit
Admission: EM | Admit: 2021-04-01 | Discharge: 2021-04-01 | Disposition: A | Discharge status: Other Acute Inpatient Hospital | Payer: Medicaid Other | Attending: Emergency Medicine | Admitting: Emergency Medicine

## 2021-04-01 ENCOUNTER — Ambulatory Visit (INDEPENDENT_AMBULATORY_CARE_PROVIDER_SITE_OTHER): Payer: Medicaid Other

## 2021-04-01 DIAGNOSIS — R1110 Vomiting, unspecified: Secondary | ICD-10-CM

## 2021-04-01 DIAGNOSIS — R1084 Generalized abdominal pain: Secondary | ICD-10-CM

## 2021-04-01 DIAGNOSIS — K56609 Unspecified intestinal obstruction, unspecified as to partial versus complete obstruction: Secondary | ICD-10-CM | POA: Diagnosis not present

## 2021-04-01 DIAGNOSIS — R112 Nausea with vomiting, unspecified: Secondary | ICD-10-CM | POA: Diagnosis not present

## 2021-04-01 DIAGNOSIS — R109 Unspecified abdominal pain: Secondary | ICD-10-CM | POA: Diagnosis not present

## 2021-04-01 DIAGNOSIS — Z20822 Contact with and (suspected) exposure to covid-19: Secondary | ICD-10-CM | POA: Insufficient documentation

## 2021-04-01 DIAGNOSIS — A059 Bacterial foodborne intoxication, unspecified: Secondary | ICD-10-CM | POA: Diagnosis not present

## 2021-04-01 LAB — POCT URINALYSIS DIP (DEVICE)
Bilirubin Urine: NEGATIVE
Glucose, UA: NEGATIVE mg/dL
Hgb urine dipstick: NEGATIVE
Ketones, ur: NEGATIVE mg/dL
Leukocytes,Ua: NEGATIVE
Nitrite: NEGATIVE
Protein, ur: NEGATIVE mg/dL
Specific Gravity, Urine: 1.02 (ref 1.005–1.030)
Urobilinogen, UA: 0.2 mg/dL (ref 0.0–1.0)
pH: 8.5 — ABNORMAL HIGH (ref 5.0–8.0)

## 2021-04-01 MED ORDER — KETOROLAC TROMETHAMINE 60 MG/2ML IM SOLN
30.0000 mg | Freq: Once | INTRAMUSCULAR | Status: AC
  Administered 2021-04-01: 30 mg via INTRAMUSCULAR

## 2021-04-01 NOTE — ED Notes (Signed)
Patient is being discharged from the Urgent Care and sent to the Emergency Department via POV . Per Dr. Chaney Malling, patient is in need of higher level of care due to Bowel Obstruction. Patient is aware and verbalizes understanding of plan of care.  Vitals:   04/01/21 1810  BP: (!) 141/66  Pulse: 72  Resp: 18  Temp: 98.4 F (36.9 C)  SpO2: 100%

## 2021-04-01 NOTE — ED Triage Notes (Signed)
Pt c/o waking up with LBP, nausea and emesis. Pt states he has had occasional emesis since this morning. Pt is concerned he may have food poisoning, states he gets it frequently. Pt denies fever.

## 2021-04-01 NOTE — Discharge Instructions (Addendum)
I am concerned that you have a very early small bowel obstruction and that you need more advanced imaging to rule out recurrent obstruction.  Do not have anything to eat and drink until your ER evaluation is complete.

## 2021-04-01 NOTE — ED Provider Notes (Signed)
HPI  SUBJECTIVE:  Allen Mays is a 20 y.o. male who presents with "food poisoning" after eating [employer] last night.  Unsure if it was undercooked.  No raw foods, questionable leftovers.  He did not share his meal with anyone else.Marland Kitchen  He reports intermittent, crampy diffuse abdominal pain starting in his pelvis and radiating up both of his flanks and to his back.  He reports 4-5 episodes of nonbilious, nonbloody emesis but is able to tolerate small sips.  He had a normal bowel movement last night.  No diarrhea, melena, hematochezia.  No lightheadedness, dizziness.  He reports decreased urine output-has urinated once today.  No urinary complaints, penile, testicular complaints, abdominal distention.  No fevers, body aches, nasal congestion, sore throat, loss of sense of smell or taste, cough, shortness of breath.  No known exposure to COVID.  He had the COVID booster.  He took Tylenol within 6 hours of evaluation.  No recent travel, no sick contacts with similar symptoms.  Car ride over here was not painful.  Denies marijuana use.  He tried ginger ale, Zofran, Tylenol.  Symptoms improved after having a bowel movement, worse with movement.  He has a past medical history of perforated appendicitis status post ex lap and ileostomy, status post small bowel resection, possible Crohn's disease.   Patient seen here 5/6 for the same, concern for ileus, was ultimately found to have a small bowel obstruction.  Admitted to the [facility] hospital for several days and SBO was managed medically.  Past Medical History:  Diagnosis Date   Asthma    Hip fracture (HCC)     Past Surgical History:  Procedure Laterality Date   APPENDECTOMY     COLON SURGERY      Family History  Problem Relation Age of Onset   Healthy Mother    Other Father        unknown medical history    Social History   Tobacco Use   Smoking status: Never   Smokeless tobacco: Never  Vaping Use   Vaping Use: Never used  Substance Use  Topics   Alcohol use: No   Drug use: No    No current facility-administered medications for this encounter.  Current Outpatient Medications:    Lactobacillus (PROBIOTIC ACIDOPHILUS) CAPS, , Disp: , Rfl:   Allergies  Allergen Reactions   Justicia Adhatoda (Malabar Nut Tree) [Justicia Adhatoda] Anaphylaxis   Eggs Or Egg-Derived Products Itching    PER ALLERGY  TESTING PER ALLERGY  TESTING    Lac Bovis Itching    Pt is only allergic to milk that he drinks. He can have food with breading, etc that contains milk-based products. He can also have butter. He only cannot have milk to drink and no cheeses. Pt is only allergic to milk that he drinks. He can have food with breading, etc that contains milk-based products. He can also have butter. He only cannot have milk to drink and no cheeses.    Lactase Itching     ROS  As noted in HPI.   Physical Exam  BP (!) 141/66 (BP Location: Left Arm)   Pulse 72   Temp 98.4 F (36.9 C) (Oral)   Resp 18   Ht 5\' 11"  (1.803 m)   Wt 77.1 kg   SpO2 100%   BMI 23.71 kg/m   Constitutional: Well developed, well nourished, no acute distress Eyes:  EOMI, conjunctiva normal bilaterally HENT: Normocephalic, atraumatic,mucus membranes moist Respiratory: Normal inspiratory effort Cardiovascular: Normal rate GI:  Positive ex lap, right lower quadrant scar.  Positive suprapubic, right flank tenderness.  Negative Murphy, no other abdominal tenderness.  No guarding, rebound.  No distention, soft, flat, no guarding, rebound Back: No CVAT GU: Normal circumcised male, testes descended bilaterally.  No penile rash, discharge.  No testicular, epididymal swelling, tenderness.  No scrotal swelling.  No inguinal bulging.  Patient declined chaperone. skin: No rash, skin intact Musculoskeletal: no deformities Neurologic: Alert & oriented x 3, no focal neuro deficits Psychiatric: Speech and behavior appropriate   ED Course   Medications  ketorolac (TORADOL)  injection 30 mg (30 mg Intramuscular Given 04/01/21 1917)    Orders Placed This Encounter  Procedures   SARS CORONAVIRUS 2 (TAT 6-24 HRS) Nasopharyngeal Nasopharyngeal Swab    Standing Status:   Standing    Number of Occurrences:   1    Order Specific Question:   Is this test for diagnosis or screening    Answer:   Diagnosis of ill patient    Order Specific Question:   Symptomatic for COVID-19 as defined by CDC    Answer:   Yes    Order Specific Question:   Date of Symptom Onset    Answer:   03/31/2021    Order Specific Question:   Hospitalized for COVID-19    Answer:   No    Order Specific Question:   Admitted to ICU for COVID-19    Answer:   No    Order Specific Question:   Previously tested for COVID-19    Answer:   No    Order Specific Question:   Resident in a congregate (group) care setting    Answer:   No    Order Specific Question:   Employed in healthcare setting    Answer:   No    Order Specific Question:   Has patient completed COVID vaccination(s) (2 doses of Pfizer/Moderna 1 dose of [employer])    Answer:   Yes    Order Specific Question:   Has patient completed COVID Booster / 3rd dose    Answer:   Yes   DG Abd Acute W/Chest    Standing Status:   Standing    Number of Occurrences:   1    Order Specific Question:   Reason for Exam (SYMPTOM  OR DIAGNOSIS REQUIRED)    Answer:   Right flank, suprapubic pain.  History of ileus, small bowel obstruction, status post bowel resection.  Rule out any acute changes.   After Hours POC Urinalysis Dipstick    Standing Status:   Standing    Number of Occurrences:   1   POCT urinalysis dip (device)    Standing Status:   Standing    Number of Occurrences:   1    Results for orders placed or performed during the hospital encounter of 04/01/21 (from the past 24 hour(s))  POCT urinalysis dip (device)     Status: Abnormal   Collection Time: 04/01/21  6:56 PM  Result Value Ref Range   Glucose, UA NEGATIVE NEGATIVE mg/dL    Bilirubin Urine NEGATIVE NEGATIVE   Ketones, ur NEGATIVE NEGATIVE mg/dL   Specific Gravity, Urine 1.020 1.005 - 1.030   Hgb urine dipstick NEGATIVE NEGATIVE   pH 8.5 (H) 5.0 - 8.0   Protein, ur NEGATIVE NEGATIVE mg/dL   Urobilinogen, UA 0.2 0.0 - 1.0 mg/dL   Nitrite NEGATIVE NEGATIVE   Leukocytes,Ua NEGATIVE NEGATIVE   DG Abd Acute W/Chest  Result Date: 04/01/2021  CLINICAL DATA:  Generalized abdominal pain with occasional vomiting EXAM: DG ABDOMEN ACUTE WITH 1 VIEW CHEST COMPARISON:  01/26/2020 FINDINGS: Cardiac shadow is within normal limits. Lungs are clear bilaterally. Scattered large and small bowel gas is noted. Mildly dilated loops of small bowel are noted with air-fluid levels consistent with an early partial small bowel obstruction. Colonic air and fecal material is noted. No free air is seen. No bony abnormality is noted. Postsurgical changes are seen in the right lower quadrant consistent with prior surgery. IMPRESSION: Mildly dilated loops of small bowel with air-fluid levels consistent with early partial small bowel obstruction. CT evaluation is recommended. Critical Value/emergent results were called by telephone at the time of interpretation on 04/01/2021 at 7:49 pm to Dr. Domenick Gong , who verbally acknowledged these results. Electronically Signed   By: Alcide Clever M.D.   On: 04/01/2021 19:49    ED Clinical Impression  1. Abdominal pain, unspecified abdominal location   2. Nausea and vomiting, intractability of vomiting not specified, unspecified vomiting type      ED Assessment/Plan  Previous records reviewed.  As noted in HPI.     Will check UA.  Concern for UTI or nephrolithiasis although he states that this is identical to previous episodes of food poisoning.  CT, labs not available here.  Will check acute abdominal series chest CT is not available to rule out recurrent SBO.  His vitals are normal, he appears comfortable, nontoxic. COVID also sent.  30 mg Toradol IM,  Zofran.  UA negative for UTI, hematuria.  Patient had persistent vomiting and pain despite Zofran and Toradol.  Reviewed imaging independently.  Dilated loop of small bowel which could be from adhesions, some air-fluid levels consistent with early partial SBO.  Recommends CT.  See radiology report for full details.  Offered transfer to [facility], patient has opted to go to [place].   Feel that patient is stable to go by private vehicle.  Notified [facility] emergency department staff.  Discussed labs, imaging, MDM, treatment plan, and rationale for transfer to the emergency department with the patient.  He agrees with plan.  Meds ordered this encounter  Medications   ketorolac (TORADOL) injection 30 mg      *This clinic note was created using Scientist, clinical (histocompatibility and immunogenetics). Therefore, there may be occasional mistakes despite careful proofreading.  ?    Domenick Gong, MD 04/01/21 1958

## 2021-04-02 LAB — SARS CORONAVIRUS 2 (TAT 6-24 HRS): SARS Coronavirus 2: NEGATIVE

## 2021-04-09 IMAGING — CR DG CHEST 2V
2 series · 2 of 2 positions shown · non-contrast
Comparison: None.

CLINICAL DATA: Epigastric pain.

EXAM:
CHEST - 2 VIEW

[chest pa]
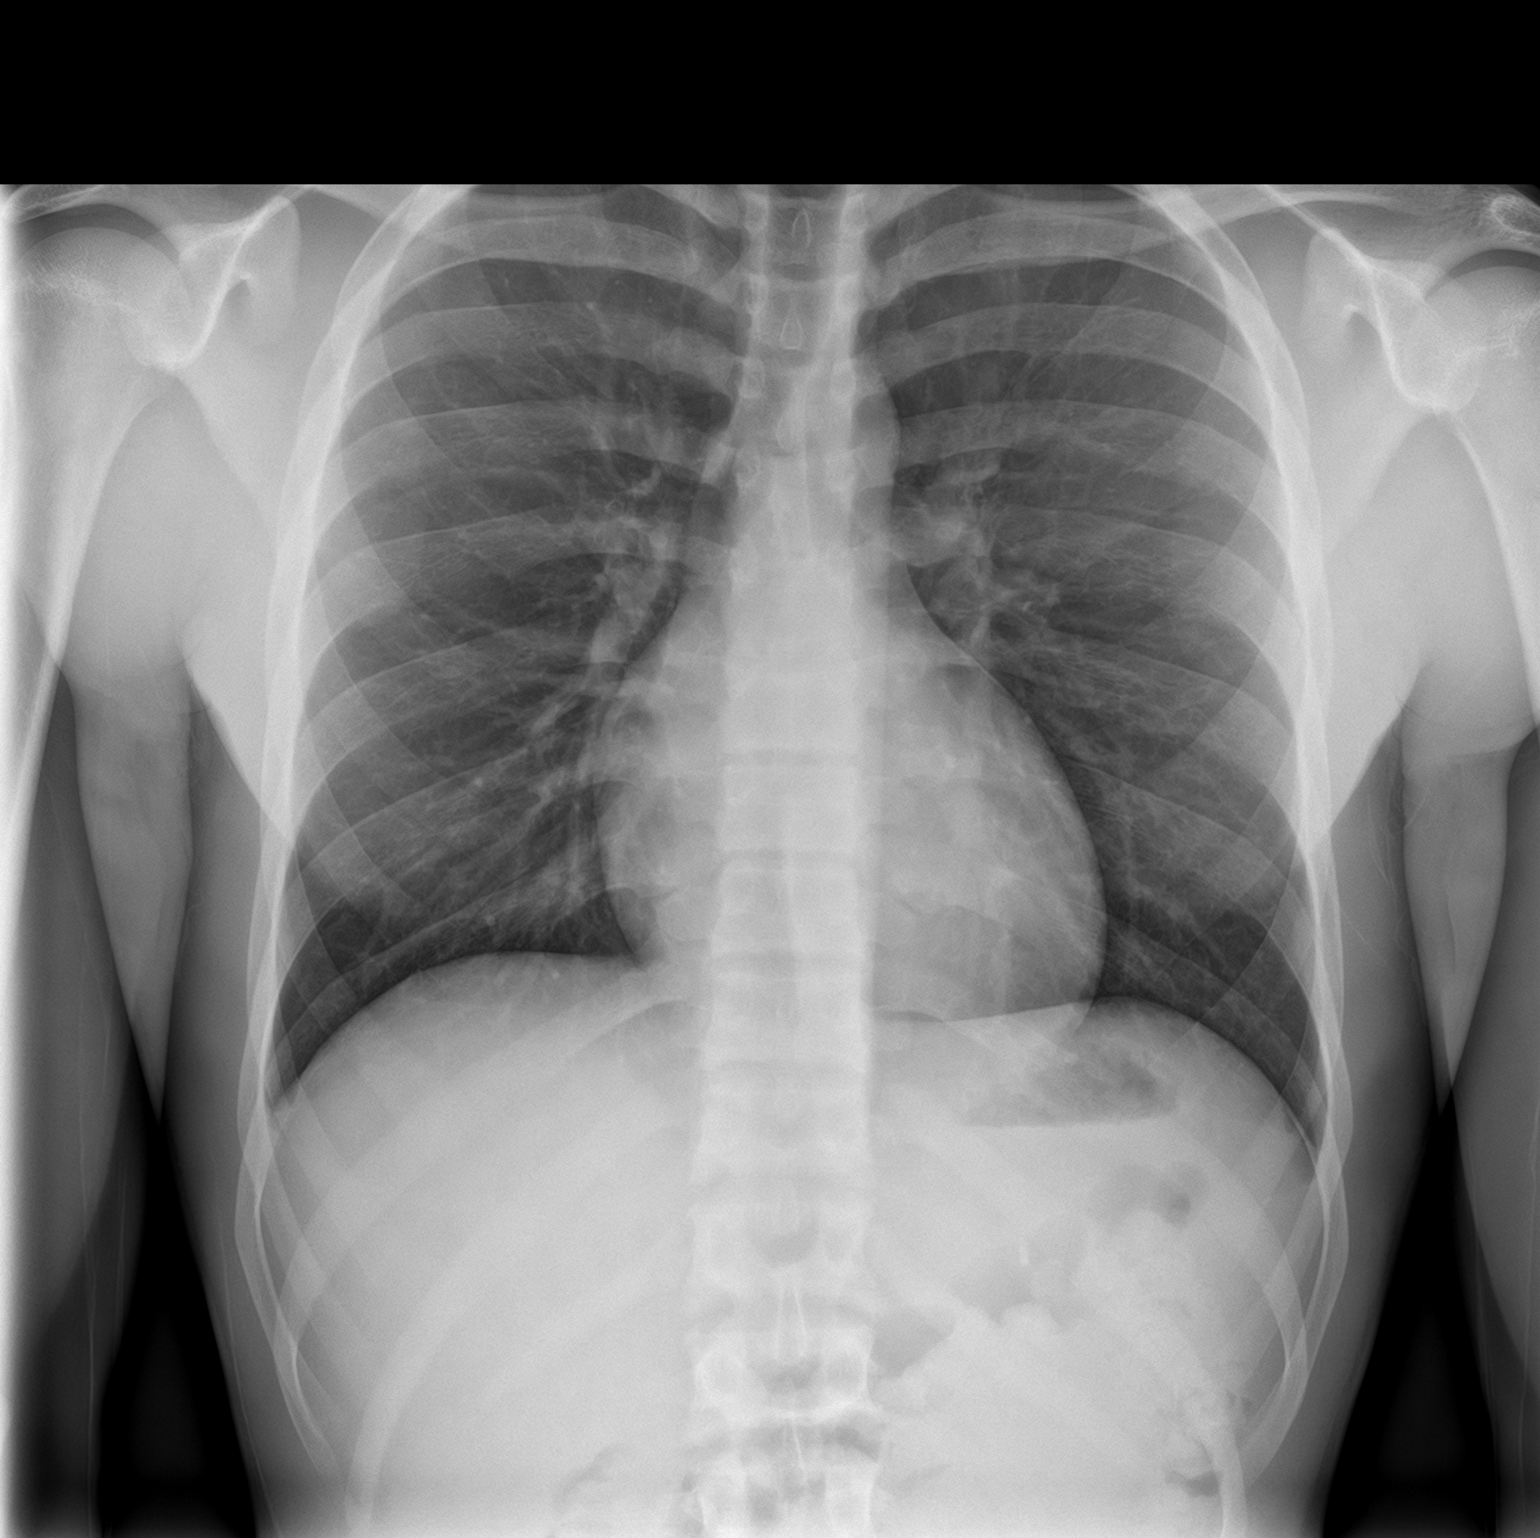

[chest lat]
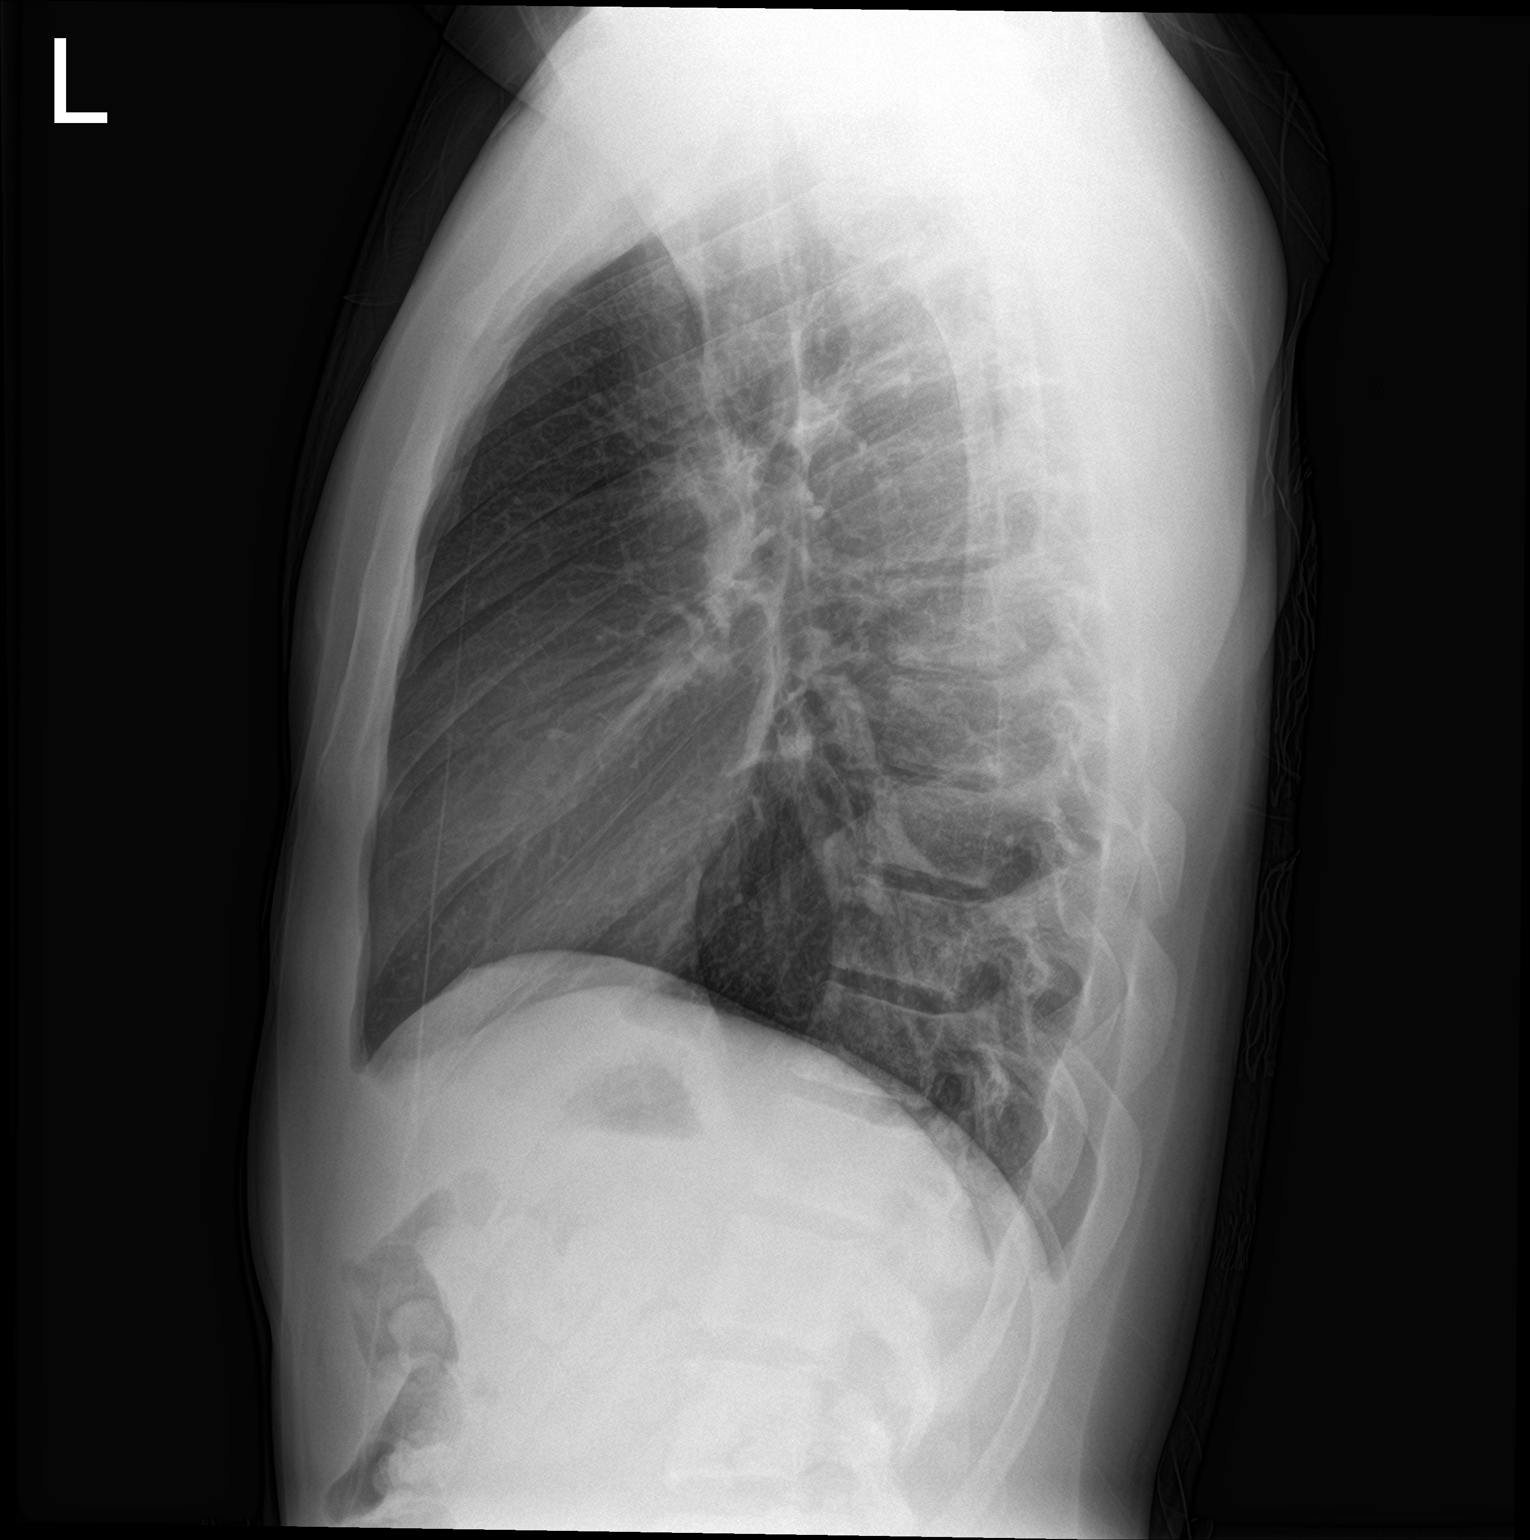

[2 of 2 positions shown; findings below may reference images not displayed]

FINDINGS: The heart size and mediastinal contours are within normal limits.
Both lungs are clear. The visualized skeletal structures are
unremarkable.
IMPRESSION: No active cardiopulmonary disease.

## 2022-01-16 HISTORY — PX: COLON SURGERY: SHX602

## 2022-02-02 ENCOUNTER — Emergency Department
Admission: EM | Admit: 2022-02-02 | Discharge: 2022-02-02 | Disposition: A | Discharge status: Home / Self Care | Payer: Medicaid Other | Attending: Emergency Medicine | Admitting: Emergency Medicine

## 2022-02-02 ENCOUNTER — Encounter: Payer: Self-pay | Admitting: Emergency Medicine

## 2022-02-02 ENCOUNTER — Other Ambulatory Visit: Payer: Self-pay

## 2022-02-02 DIAGNOSIS — Z4802 Encounter for removal of sutures: Secondary | ICD-10-CM | POA: Diagnosis not present

## 2022-02-02 DIAGNOSIS — Z5189 Encounter for other specified aftercare: Secondary | ICD-10-CM

## 2022-02-02 DIAGNOSIS — Z4801 Encounter for change or removal of surgical wound dressing: Secondary | ICD-10-CM | POA: Diagnosis present

## 2022-02-02 NOTE — ED Notes (Signed)
Pt verbalizes understanding of d/c instructions and follow up. 

## 2022-02-02 NOTE — ED Provider Notes (Signed)
? ?[facility] ?Provider Note ? ? ? None  ?  (approximate) ? ? ?History  ? ?Suture / Staple Removal ? ? ?HPI ? ?Allen Mays is a 21 y.o. male who presents to the ED for evaluation of Suture / Staple Removal ?  ?I reviewed recent [facility] surgical admission and ex lap for SBO performed on 4/27.  Patient reports multiple obstructions in the past and having had now 2 surgeries and multiple medical managements of SBO's in the past.  He has had a colonic resection and colostomy in the past, since taken down and reanastomosed. ? ?Patient presents to the ED for evaluation of staple removal.  He reports he recently moved to the area and has been unable to follow-up with any surgeons locally or at [facility] where the surgery was performed. ? ?Reports he has been doing well since his surgery couple weeks ago, tolerating p.o. intake and toileting at his baseline without abdominal pain.  Reports he has gained weight back and doing well.  No fevers, dehiscence of the wound or discharge.  Says he feels fine, just here for staples out.  We discussed local surgeons that he can follow-up with. ? ? ?Physical Exam  ? ?Triage Vital Signs: ?ED Triage Vitals  ?Enc Vitals Group  ?   BP 02/02/22 0836 (!) 145/91  ?   Pulse Rate 02/02/22 0836 68  ?   Resp 02/02/22 0837 20  ?   Temp 02/02/22 0836 98.3 ?F (36.8 ?C)  ?   Temp Source 02/02/22 0836 Oral  ?   SpO2 02/02/22 0836 100 %  ?   Weight 02/02/22 0836 169 lb 12.1 oz (77 kg)  ?   Height 02/02/22 0836 5\' 11"  (1.803 m)  ?   Head Circumference --   ?   Peak Flow --   ?   Pain Score 02/02/22 0836 0  ?   Pain Loc --   ?   Pain Edu? --   ?   Excl. in GC? --   ? ? ?Most recent vital signs: ?Vitals:  ? 02/02/22 0836 02/02/22 0837  ?BP: (!) 145/91   ?Pulse: 68   ?Resp:  20  ?Temp: 98.3 ?F (36.8 ?C)   ?SpO2: 100%   ? ? ?General: Awake, no distress.  Ambulatory with normal gait, well-appearing, conversational in full sentences. ?CV:  Good peripheral perfusion.  ?Resp:  Normal effort.   ?Abd:  No distention.  Midline surgical incision is noted, no dehiscence.  No erythema, induration, fluctuance or discharge.  Staples are present.  ?Abdomen is soft and benign throughout ?MSK:  No deformity noted.  ?Neuro:  No focal deficits appreciated. ?Other:   ? ? ?ED Results / Procedures / Treatments  ? ?Labs ?(all labs ordered are listed, but only abnormal results are displayed) ?Labs Reviewed - No data to display ? ?EKG ? ? ?RADIOLOGY ? ? ?Official radiology report(s): ?No results found. ? ?PROCEDURES and INTERVENTIONS: ? ?Procedures ? ?Medications - No data to display ? ? ?IMPRESSION / MDM / ASSESSMENT AND PLAN / ED COURSE  ?I reviewed the triage vital signs and the nursing notes. ? ?Pleasant 21 year old male who recently had an ex lap for lysis of adhesions and SBO presents to the ED for staple removal, suitable for subsequent outpatient management with a local surgeon.  Look systemically well to me.  Has a reassuring examination without wound/local pathology such as dehiscence of the wound, local surgical site infection.  Tolerating p.o. and toileting  without difficulty and has a benign abdominal examination.  No evidence of postoperative infection, dehiscence or recurrence of SBO.  We will remove his staples and have him follow-up with a local surgeon.  He recently moved to the area and we discussed local surgical options and provided information.  Discussed return precautions for the ED. ? ?  ? ? ?FINAL CLINICAL IMPRESSION(S) / ED DIAGNOSES  ? ?Final diagnoses:  ?Encounter for staple removal  ?Visit for wound check  ? ? ? ?Rx / DC Orders  ? ?ED Discharge Orders   ? ? None  ? ?  ? ? ? ?Note:  This document was prepared using Dragon voice recognition software and may include unintentional dictation errors. ?  ?Delton Prairie, MD ?02/02/22 [phone] ? ?

## 2022-02-02 NOTE — ED Notes (Signed)
15 staples removed from pt abd. ?

## 2022-02-02 NOTE — ED Triage Notes (Signed)
Pt reports had a bowel obstruction and had surgery and is here to have his staples removed. Pt with 15 staples down his mid abdomen. Denies concern for infection, states is able to eat, drink, and have bowel movements with no concerns.  ?

## 2022-04-01 ENCOUNTER — Encounter: Payer: Self-pay | Admitting: Family Medicine

## 2022-04-01 ENCOUNTER — Ambulatory Visit: Payer: Medicaid Other | Admitting: Family Medicine

## 2022-04-01 VITALS — BP 120/62 | HR 60 | Ht 71.0 in | Wt 168.4 lb

## 2022-04-01 DIAGNOSIS — K56609 Unspecified intestinal obstruction, unspecified as to partial versus complete obstruction: Secondary | ICD-10-CM

## 2022-04-01 DIAGNOSIS — L2082 Flexural eczema: Secondary | ICD-10-CM | POA: Diagnosis not present

## 2022-04-01 DIAGNOSIS — T782XXS Anaphylactic shock, unspecified, sequela: Secondary | ICD-10-CM

## 2022-04-01 DIAGNOSIS — N179 Acute kidney failure, unspecified: Secondary | ICD-10-CM | POA: Insufficient documentation

## 2022-04-01 DIAGNOSIS — Z9889 Other specified postprocedural states: Secondary | ICD-10-CM | POA: Diagnosis not present

## 2022-04-01 DIAGNOSIS — T782XXA Anaphylactic shock, unspecified, initial encounter: Secondary | ICD-10-CM | POA: Insufficient documentation

## 2022-04-01 MED ORDER — EPINEPHRINE 0.3 MG/0.3ML IJ SOAJ: 0.3000 mg | INTRAMUSCULAR | 3 refills | Status: AC | PRN

## 2022-04-01 NOTE — Assessment & Plan Note (Signed)
History of anaphylaxis assessed during childhood allergy testing.  He has multiple allergies, plan for Rx EpiPen to have on hand, referral to allergist for further evaluation and management.

## 2022-04-01 NOTE — Patient Instructions (Signed)
-   Referral coordinator will contact you in regards to scheduling visits with gastroenterology and allergy groups - Reach out to your prior surgeon regarding your previous follow-up - Have EpiPen on hand - Return for annual physical in 2 months

## 2022-04-01 NOTE — Progress Notes (Signed)
     Primary Care / Sports Medicine Office Visit  Patient Information:  Patient ID: Allen Mays, male DOB: Feb 06, 2001 Age: 21 y.o. MRN: [id number]   Allen Mays is a pleasant 21 y.o. male presenting with the following:  Chief Complaint  Patient presents with   Establish Care    Needs GI, had ostomy bag after appendix being removed and took to much colon, has had multiple bowel obstructions since.     Vitals:   04/01/22 1421  BP: 120/62  Pulse: 60  SpO2: 99%   Vitals:   04/01/22 1421  Weight: 168 lb 6.4 oz (76.4 kg)  Height: 5\' 11"  (1.803 m)   Body mass index is 23.49 kg/m.  No results found.   Independent interpretation of notes and tests performed by another provider:   None  Procedures performed:   None  Pertinent History, Exam, Impression, and Recommendations:   Problem List Items Addressed This Visit       Digestive   Small bowel obstruction (HCC) - Primary    See additional assessment(s) for plan details.      Relevant Orders   Ambulatory referral to Gastroenterology     Musculoskeletal and Integument   Flexural eczema    Chronic issue of the same involving bilateral flexion creases at the elbows, secondarily noted to the popliteal regions bilaterally.  Current management with Aquaphor which has improved symptoms, though without resolution.  Given multiple comorbid allergies including anaphylaxis, referral to allergist placed for optimization.      Relevant Orders   Ambulatory referral to Allergy     Other   History of colostomy reversal    Patient with history of appendectomy with described sequela including multiple SBO, colostomy status post reversal, and associated adhesions.  Did recently undergo ex lap with lysis of lesions with general surgery group out of [place].  Plan for establishment of care referral with local gastroenterology group for surveillance.      Relevant Orders   Ambulatory referral to Gastroenterology   Anaphylactic  syndrome    History of anaphylaxis assessed during childhood allergy testing.  He has multiple allergies, plan for Rx EpiPen to have on hand, referral to allergist for further evaluation and management.      Relevant Medications   EPINEPHrine (EPIPEN 2-PAK) 0.3 mg/0.3 mL IJ SOAJ injection   Other Relevant Orders   Ambulatory referral to Allergy     Orders & Medications Meds ordered this encounter  Medications   EPINEPHrine (EPIPEN 2-PAK) 0.3 mg/0.3 mL IJ SOAJ injection    Sig: Inject 0.3 mg into the muscle as needed for anaphylaxis.    Dispense:  2 each    Refill:  3   Orders Placed This Encounter  Procedures   Ambulatory referral to Gastroenterology   Ambulatory referral to Allergy     Return in about 2 months (around 06/02/2022) for Annual Physical.     08/02/2022, MD   Primary Care Sports Medicine [facility]

## 2022-04-01 NOTE — Assessment & Plan Note (Signed)
Chronic issue of the same involving bilateral flexion creases at the elbows, secondarily noted to the popliteal regions bilaterally.  Current management with Aquaphor which has improved symptoms, though without resolution.  Given multiple comorbid allergies including anaphylaxis, referral to allergist placed for optimization.

## 2022-04-01 NOTE — Assessment & Plan Note (Signed)
See additional assessment(s) for plan details. 

## 2022-04-01 NOTE — Assessment & Plan Note (Signed)
Patient with history of appendectomy with described sequela including multiple SBO, colostomy status post reversal, and associated adhesions.  Did recently undergo ex lap with lysis of lesions with general surgery group out of [place].  Plan for establishment of care referral with local gastroenterology group for surveillance.

## 2022-04-03 ENCOUNTER — Ambulatory Visit: Payer: Medicaid Other | Admitting: Gastroenterology

## 2022-06-02 ENCOUNTER — Ambulatory Visit: Payer: Medicaid Other | Admitting: Family Medicine

## 2022-10-17 ENCOUNTER — Ambulatory Visit: Payer: Medicaid Other | Admitting: Family Medicine

## 2022-10-22 ENCOUNTER — Encounter: Payer: Self-pay | Admitting: Family Medicine

## 2022-10-22 ENCOUNTER — Ambulatory Visit: Payer: Medicaid Other | Admitting: Family Medicine

## 2022-10-22 VITALS — BP 120/78 | HR 80 | Ht 71.0 in | Wt 170.0 lb

## 2022-10-22 DIAGNOSIS — Z91010 Allergy to peanuts: Secondary | ICD-10-CM | POA: Insufficient documentation

## 2022-10-22 DIAGNOSIS — Z91012 Allergy to eggs: Secondary | ICD-10-CM | POA: Insufficient documentation

## 2022-10-22 DIAGNOSIS — T782XXS Anaphylactic shock, unspecified, sequela: Secondary | ICD-10-CM

## 2022-10-22 DIAGNOSIS — J309 Allergic rhinitis, unspecified: Secondary | ICD-10-CM | POA: Insufficient documentation

## 2022-10-22 DIAGNOSIS — J4520 Mild intermittent asthma, uncomplicated: Secondary | ICD-10-CM | POA: Insufficient documentation

## 2022-10-22 DIAGNOSIS — Z9889 Other specified postprocedural states: Secondary | ICD-10-CM | POA: Diagnosis not present

## 2022-10-22 DIAGNOSIS — K56609 Unspecified intestinal obstruction, unspecified as to partial versus complete obstruction: Secondary | ICD-10-CM

## 2022-10-22 NOTE — Assessment & Plan Note (Signed)
Multiple environmental allergies, was able to establish with allergist after prior referral, does have serum studies ordered to further evaluate for food related allergies, will follow peripherally on this issue.

## 2022-10-22 NOTE — Patient Instructions (Signed)
-  Maintain follow-up with Allergist and allergy testing - Referral coordinator will contact for scheduling with GI - Return with 2 months

## 2022-10-22 NOTE — Progress Notes (Signed)
     Primary Care / Sports Medicine Office Visit  Patient Information:  Patient ID: Allen Mays, male DOB: 2001-04-27 Age: 22 y.o. MRN: [id number]   Allen Mays is a pleasant 22 y.o. male presenting with the following:  Chief Complaint  Patient presents with   GI Problem    Vitals:   10/22/22 0952  BP: 120/78  Pulse: 80  SpO2: 98%   Vitals:   10/22/22 0952  Weight: 170 lb (77.1 kg)  Height: 5\' 11"  (1.803 m)   Body mass index is 23.71 kg/m.  No results found.   Independent interpretation of notes and tests performed by another provider:   None  Procedures performed:   None  Pertinent History, Exam, Impression, and Recommendations:   Allen Mays was seen today for gi problem.  Small bowel obstruction (HCC) Overview: Patient with history of SBO in the setting of prior appendectomy, necessitating colostomy, now status post reversal, associated adhesions.  Did have exploratory laparotomy with lysis of lesions with general surgery group out of [place].  Assessment & Plan: Please see overview, prior referral to GI, patient was unable to maintain that visit, has continued to note alternating constipation/diarrhea, no significant abdominal pain.  Examination with well-healed surgical scars consistent with his medical history, normoactive bowel sounds, no hepatosplenomegaly, nontender throughout.  Referral to GI placed today, will follow peripherally on this issue.  Orders: -     Ambulatory referral to Gastroenterology  History of colostomy reversal -     Ambulatory referral to Gastroenterology  Anaphylaxis, sequela Assessment & Plan: Multiple environmental allergies, was able to establish with allergist after prior referral, does have serum studies ordered to further evaluate for food related allergies, will follow peripherally on this issue.      Orders & Medications No orders of the defined types were placed in this encounter.  Orders Placed This Encounter   Procedures   Ambulatory referral to Gastroenterology     Return in about 2 months (around 12/21/2022) for CPE.     Montel Culver, MD, [facility]   Primary Care Sports Medicine Primary Care and Sports Medicine at [facility]

## 2022-10-22 NOTE — Assessment & Plan Note (Signed)
Please see overview, prior referral to GI, patient was unable to maintain that visit, has continued to note alternating constipation/diarrhea, no significant abdominal pain.  Examination with well-healed surgical scars consistent with his medical history, normoactive bowel sounds, no hepatosplenomegaly, nontender throughout.  Referral to GI placed today, will follow peripherally on this issue.

## 2022-12-22 ENCOUNTER — Encounter: Payer: Medicaid Other | Admitting: Family Medicine

## 2022-12-23 ENCOUNTER — Encounter: Payer: Self-pay | Admitting: Family Medicine

## 2023-02-09 ENCOUNTER — Encounter: Payer: Self-pay | Admitting: Gastroenterology

## 2023-02-09 ENCOUNTER — Ambulatory Visit: Payer: Medicaid Other | Admitting: Gastroenterology

## 2023-02-09 VITALS — BP 129/77 | HR 76 | Temp 98.7°F | Ht 71.0 in | Wt 166.0 lb

## 2023-02-09 DIAGNOSIS — K56609 Unspecified intestinal obstruction, unspecified as to partial versus complete obstruction: Secondary | ICD-10-CM

## 2023-02-09 NOTE — Progress Notes (Signed)
Gastroenterology Consultation  Referring Provider:     Jerrol Banana, MD Primary Care Physician:  Jerrol Banana, MD Primary Gastroenterologist:  Dr. Servando Snare     Reason for Consultation:     Alternating diarrhea and constipation        HPI:   Allen Mays is a 22 y.o. y/o male referred for consultation & management of alternating diarrhea and constipation by Dr. Ashley Royalty, Ocie Bob, MD. This patient comes in today with a history of having a small bowel obstruction presumably from adhesions from a previous appendicitis and appendectomy.  The patient had a history of having a colostomy.  He also underwent a exploratory laparotomy with lysis of adhesions. This was in 2016 when he was 22 years old. He has had multiple bowel obstructions in the past and mostly treated with an NG tube. The patient is concerned about what he can do to avoid further attacks of small bowel obstruction.  The patient was told by his doctor in the hospital that a low fiber diet would be helpful.  The patient also reports that he was told he had an allergy to milk when he was a kid and he would swell up and have a reaction to milk but had allergy testing that showed he is not allergic to milk.  He does not like to drink milk or eat ice cream but he does report that he has cheese in his diet.  The patient denies any unexplained weight loss fevers chills black stools or bloody stools.  He does report that he has some alternating diarrhea and constipation.  Past Medical History:  Diagnosis Date   Asthma    Hip fracture [facility])     Past Surgical History:  Procedure Laterality Date   APPENDECTOMY  2015   COLON SURGERY  01/16/2022   Exploratory laparotomy with lysis of adhesions    Prior to Admission medications   Medication Sig Start Date End Date Taking? Authorizing Provider  albuterol (VENTOLIN HFA) 108 (90 Base) MCG/ACT inhaler 1 puff as needed Inhalation every 4-6 hrs prn for cough/wheeze for 90 days 09/12/22    [provider]  cetirizine (ZYRTEC) 10 MG tablet 1 tablet Orally Once a day for 30 days Patient not taking: Reported on 10/22/2022 09/12/22   [provider]  EPINEPHrine (EPIPEN 2-PAK) 0.3 mg/0.3 mL IJ SOAJ injection Inject 0.3 mg into the muscle as needed for anaphylaxis. 04/01/22   Jerrol Banana, MD    Family History  Problem Relation Age of Onset   Healthy Mother    Other Father        unknown medical history     Social History   Tobacco Use   Smoking status: Never   Smokeless tobacco: Never  Vaping Use   Vaping Use: Never used  Substance Use Topics   Alcohol use: No   Drug use: No    Allergies as of 02/09/2023 - Review Complete 10/22/2022  Allergen Reaction Noted   Justicia adhatoda (malabar nut tree) [justicia adhatoda] Anaphylaxis 05/15/2015   Ibuprofen  01/13/2022   Egg-derived products Itching 06/15/2015   Milk (cow) Itching 05/17/2015   Tilactase Itching 11/18/2015    Review of Systems:    All systems reviewed and negative except where noted in HPI.   Physical Exam:  There were no vitals taken for this visit. No LMP for male patient. General:   Alert,  Well-developed, well-nourished, pleasant and cooperative in NAD Head:  Normocephalic and atraumatic. Eyes:  Sclera clear, no icterus.   Conjunctiva pink. Ears:  Normal auditory acuity. Neck:  Supple; no masses or thyromegaly. Lungs:  Respirations even and unlabored.  Clear throughout to auscultation.   No wheezes, crackles, or rhonchi. No acute distress. Heart:  Regular rate and rhythm; no murmurs, clicks, rubs, or gallops. Abdomen:  Normal bowel sounds.  No bruits.  Soft, non-tender and non-distended without masses, hepatosplenomegaly or hernias noted.  No guarding or rebound tenderness.  Negative Carnett sign.   Rectal:  Deferred.  Pulses:  Normal pulses noted. Extremities:  No clubbing or edema.  No cyanosis. Neurologic:  Alert and oriented x3;  grossly normal neurologically. Skin:   Intact without significant lesions or rashes.  No jaundice. Lymph Nodes:  No significant cervical adenopathy. Psych:  Alert and cooperative. Normal mood and affect.  Imaging Studies: No results found.  Assessment and Plan:   Allen Mays is a 22 y.o. 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  The patient has had multiple bouts of small bowel obstruction.  The patient has been told that if he is having alternating diarrhea and constipation it may be related to the foods he is eating versus possible dairy intake.  The patient has been told that he should try and see if any foods are making his symptoms better or worse.  The patient has also been told to contact me if he has any rectal bleeding or significant change in bowel habits.  He has also been told to go to emergency department if he should have another bout of small bowel obstruction.  The patient has been explained the plan and agrees with it.    Midge Minium, MD. Clementeen Graham    Note: This dictation was prepared with Dragon dictation along with smaller phrase technology. Any transcriptional errors that result from this process are unintentional.

## 2023-07-03 ENCOUNTER — Ambulatory Visit: Payer: Medicaid Other | Admitting: Family Medicine

## 2023-07-20 ENCOUNTER — Ambulatory Visit (INDEPENDENT_AMBULATORY_CARE_PROVIDER_SITE_OTHER): Payer: Medicaid Other | Admitting: Family Medicine

## 2023-07-20 ENCOUNTER — Encounter: Payer: Self-pay | Admitting: Family Medicine

## 2023-07-20 VITALS — BP 130/82 | HR 64 | Temp 97.9°F | Ht 71.0 in | Wt 169.4 lb

## 2023-07-20 DIAGNOSIS — Z23 Encounter for immunization: Secondary | ICD-10-CM | POA: Diagnosis not present

## 2023-07-20 DIAGNOSIS — Z6823 Body mass index (BMI) 23.0-23.9, adult: Secondary | ICD-10-CM | POA: Diagnosis not present

## 2023-07-20 DIAGNOSIS — Z0000 Encounter for general adult medical examination without abnormal findings: Secondary | ICD-10-CM

## 2023-07-20 DIAGNOSIS — Z114 Encounter for screening for human immunodeficiency virus [HIV]: Secondary | ICD-10-CM | POA: Diagnosis not present

## 2023-07-20 DIAGNOSIS — Z1159 Encounter for screening for other viral diseases: Secondary | ICD-10-CM

## 2023-07-20 DIAGNOSIS — Z7689 Persons encountering health services in other specified circumstances: Secondary | ICD-10-CM

## 2023-07-20 LAB — CBC WITH DIFFERENTIAL/PLATELET
Basophils Absolute: 0 10*3/uL (ref 0.0–0.1)
Basophils Relative: 0.3 % (ref 0.0–3.0)
Eosinophils Absolute: 0.3 10*3/uL (ref 0.0–0.7)
Eosinophils Relative: 6.4 % — ABNORMAL HIGH (ref 0.0–5.0)
HCT: 43 % (ref 39.0–52.0)
Hemoglobin: 13.2 g/dL (ref 13.0–17.0)
Lymphocytes Relative: 50.8 % — ABNORMAL HIGH (ref 12.0–46.0)
Lymphs Abs: 2.1 10*3/uL (ref 0.7–4.0)
MCHC: 30.7 g/dL (ref 30.0–36.0)
MCV: 78.2 fL (ref 78.0–100.0)
Monocytes Absolute: 0.5 10*3/uL (ref 0.1–1.0)
Monocytes Relative: 12.7 % — ABNORMAL HIGH (ref 3.0–12.0)
Neutro Abs: 1.2 10*3/uL — ABNORMAL LOW (ref 1.4–7.7)
Neutrophils Relative %: 29.8 % — ABNORMAL LOW (ref 43.0–77.0)
Platelets: 192 10*3/uL (ref 150.0–400.0)
RBC: 5.5 Mil/uL (ref 4.22–5.81)
RDW: 14.4 % (ref 11.5–15.5)
WBC: 4.2 10*3/uL (ref 4.0–10.5)

## 2023-07-20 NOTE — Progress Notes (Signed)
New Patient Office Visit  Subjective    Patient ID: Allen Mays, male    DOB: [birth year]  Age: 22 y.o. MRN: [id number]  CC:  Chief Complaint  Patient presents with   Establish Care    HPI Allen Mays presents to establish care with new provider.   Patients previous primary care provider: [facility] at [facility], with Dr. Joseph Berkshire. Last seen 10/22/2022.   Specialist: [facility] at [facility], Dr. Midge Minium. Last visit 02/09/2023.   Patient has no concerns. 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 He just wanted labs.  Outpatient Encounter Medications as of 07/20/2023  Medication Sig   EPINEPHrine (EPIPEN 2-PAK) 0.3 mg/0.3 mL IJ SOAJ injection Inject 0.3 mg into the muscle as needed for anaphylaxis.   No facility-administered encounter medications on file as of 07/20/2023.    Past Medical History:  Diagnosis Date   Asthma    Bowel obstruction (HCC)    Hip fracture [facility])     Past Surgical History:  Procedure Laterality Date   APPENDECTOMY  2015   COLON SURGERY  01/16/2022   Exploratory laparotomy with lysis of adhesions    Family History  Problem Relation Age of Onset   Healthy Mother    Other Father        unknown medical history    Social History   Socioeconomic History   Marital status: Single    Spouse name: Not on file   Number of children: 0   Years of education: Not on file   Highest education level: Bachelor's degree (e.g., BA, AB, BS)  Occupational History   Not on file  Tobacco Use   Smoking status: Never   Smokeless tobacco: Never  Vaping Use   Vaping status: Never Used  Substance and Sexual Activity   Alcohol use: Yes    Comment: Weekends   Drug use: Yes    Types: Marijuana    Comment: Twice a week   Sexual activity: Yes    Birth control/protection: Condom  Other Topics Concern    Not on file  Social History Narrative   Not on file   Social Determinants of Health   Financial Resource Strain: Low Risk  (01/14/2022)   [facility], [facility]   Overall Financial Resource Strain (CARDIA)    Difficulty of Paying Living Expenses: Not very hard  Food Insecurity: No Food Insecurity (07/20/2023)   Hunger Vital Sign    Worried About Running Out of Food in the Last Year: Never true    Ran Out of Food in the Last Year: Never true  Transportation Needs: No Transportation Needs (01/14/2022)   Received from [facility], [facility]   [facility] - Transportation    Lack of Transportation (Medical): No    Lack of Transportation (Non-Medical): No  Physical Activity: Sufficiently Active (07/20/2023)   Exercise Vital Sign    Days of Exercise per Week: 4 days    Minutes of Exercise per Session: 60 min  Stress: No Stress Concern Present (07/20/2023)   [employer] of Occupational Health - Occupational Stress Questionnaire    Feeling of Stress : Not at all  Social Connections: Moderately Isolated (07/20/2023)   Social Connection and Isolation Panel [NHANES]    Frequency of Communication with Friends and Family: More  than three times a week    Frequency of Social Gatherings with Friends and Family: Three times a week    Attends Religious Services: Never    Active Member of Clubs or Organizations: Yes    Attends Banker Meetings: Never    Marital Status: Never married  Intimate Partner Violence: Not At Risk (07/20/2023)   Humiliation, Afraid, Rape, and Kick questionnaire    Fear of Current or Ex-Partner: No    Emotionally Abused: No    Physically Abused: No    Sexually Abused: No    ROS See HPI above    Objective   BP 130/82 (BP Location: Left Arm, Patient Position: Sitting, Cuff Size: Normal)   Pulse 64   Temp 97.9 F (36.6 C) (Oral)   Ht 5\' 11"  (1.803 m)   Wt 169 lb 6.4 oz (76.8 kg)   SpO2 98%   BMI 23.63 kg/m    Physical Exam Vitals reviewed.  Constitutional:      General: He is not in acute distress.    Appearance: Normal appearance. He is normal weight. He is not ill-appearing, toxic-appearing or diaphoretic.  HENT:     Head: Normocephalic and atraumatic.  Eyes:     General:        Right eye: No discharge.        Left eye: No discharge.     Conjunctiva/sclera: Conjunctivae normal.  Cardiovascular:     Rate and Rhythm: Normal rate and regular rhythm.     Heart sounds: Normal heart sounds. No murmur heard.    No friction rub. No gallop.  Pulmonary:     Effort: Pulmonary effort is normal. No respiratory distress.     Breath sounds: Normal breath sounds.  Musculoskeletal:        General: Normal range of motion.     Cervical back: Neck supple.  Skin:    General: Skin is warm and dry.  Neurological:     General: No focal deficit present.     Mental Status: He is alert and oriented to person, place, and time. Mental status is at baseline.     Motor: No weakness.     Gait: Gait normal.  Psychiatric:        Mood and Affect: Mood normal.        Behavior: Behavior normal.        Thought Content: Thought content normal.        Judgment: Judgment normal.      Assessment & Plan:  Encounter to establish care -     CBC with Differential/Platelet -     Comprehensive metabolic panel -     TSH  Influenza vaccine needed -     Flu vaccine trivalent PF, 6mos and older(Flulaval,Afluria,Fluarix,Fluzone)  BMI 23.0-23.9, adult  Encounter for screening for HIV -     HIV Antibody (routine testing w rflx)  Need for hepatitis C screening test -     Hepatitis C antibody   1.Review health maintenance:  -Covid booster: Recommend to obtain at local pharmacy  -Tdap vaccine: Unknown, recommend at local pharmacy  -Hep C and HIV screening: Ordered  -HPV vaccine: Recommend to obtain at local pharmacy or health department  -Influenza vaccine: Ordered and administered 2.Ordered screening labs (CBC  with Diff, CMP, and TSH).  Return in about 1 year (around 07/19/2024) for physical.   Zandra Abts, NP

## 2023-07-20 NOTE — Patient Instructions (Signed)
-  It was a pleasure to meet you and look forward to meeting you. -Influenza vaccine administered. -Ordered labs. Office will call with lab results and you will see them on MyChart. -Follow up in 1 year for a physical.

## 2023-07-21 LAB — COMPREHENSIVE METABOLIC PANEL
ALT: 18 U/L (ref 0–53)
AST: 22 U/L (ref 0–37)
Albumin: 4.7 g/dL (ref 3.5–5.2)
Alkaline Phosphatase: 44 U/L (ref 39–117)
BUN: 14 mg/dL (ref 6–23)
CO2: 29 meq/L (ref 19–32)
Calcium: 9.7 mg/dL (ref 8.4–10.5)
Chloride: 101 meq/L (ref 96–112)
Creatinine, Ser: 1.17 mg/dL (ref 0.40–1.50)
GFR: 88.08 mL/min (ref >60.00)
Glucose, Bld: 90 mg/dL (ref 70–99)
Potassium: 3.9 meq/L (ref 3.5–5.1)
Sodium: 137 meq/L (ref 135–145)
Total Bilirubin: 0.9 mg/dL (ref 0.2–1.2)
Total Protein: 7.4 g/dL (ref 6.0–8.3)

## 2023-07-21 LAB — TSH: TSH: 1.66 u[IU]/mL (ref 0.35–5.50)

## 2023-07-21 LAB — HIV ANTIBODY (ROUTINE TESTING W REFLEX): HIV 1&2 Ab, 4th Generation: NONREACTIVE

## 2023-07-21 LAB — HEPATITIS C ANTIBODY: Hepatitis C Ab: NONREACTIVE

## 2024-07-21 ENCOUNTER — Encounter: Payer: Self-pay | Admitting: Family Medicine

## 2024-07-21 ENCOUNTER — Ambulatory Visit: Payer: Medicaid Other | Admitting: Family Medicine

## 2024-07-21 VITALS — BP 122/78 | HR 85 | Temp 98.3°F | Ht 71.0 in | Wt 174.0 lb

## 2024-07-21 DIAGNOSIS — Z6824 Body mass index (BMI) 24.0-24.9, adult: Secondary | ICD-10-CM | POA: Diagnosis not present

## 2024-07-21 DIAGNOSIS — Z0000 Encounter for general adult medical examination without abnormal findings: Secondary | ICD-10-CM | POA: Diagnosis not present

## 2024-07-21 DIAGNOSIS — Z23 Encounter for immunization: Secondary | ICD-10-CM | POA: Diagnosis not present

## 2024-07-21 LAB — COMPREHENSIVE METABOLIC PANEL WITH GFR
ALT: 13 U/L (ref 0–53)
AST: 21 U/L (ref 0–37)
Albumin: 4.9 g/dL (ref 3.5–5.2)
Alkaline Phosphatase: 43 U/L (ref 39–117)
BUN: 15 mg/dL (ref 6–23)
CO2: 31 meq/L (ref 19–32)
Calcium: 9.5 mg/dL (ref 8.4–10.5)
Chloride: 100 meq/L (ref 96–112)
Creatinine, Ser: 1.45 mg/dL (ref 0.40–1.50)
GFR: 67.6 mL/min (ref >60.00)
Glucose, Bld: 62 mg/dL — ABNORMAL LOW (ref 70–99)
Potassium: 4.5 meq/L (ref 3.5–5.1)
Sodium: 137 meq/L (ref 135–145)
Total Bilirubin: 1.5 mg/dL — ABNORMAL HIGH (ref 0.2–1.2)
Total Protein: 7.7 g/dL (ref 6.0–8.3)

## 2024-07-21 LAB — CBC WITH DIFFERENTIAL/PLATELET
Basophils Absolute: 0 K/uL (ref 0.0–0.1)
Basophils Relative: 0.3 % (ref 0.0–3.0)
Eosinophils Absolute: 0.1 K/uL (ref 0.0–0.7)
Eosinophils Relative: 2.5 % (ref 0.0–5.0)
HCT: 42.5 % (ref 39.0–52.0)
Hemoglobin: 13.5 g/dL (ref 13.0–17.0)
Lymphocytes Relative: 43.9 % (ref 12.0–46.0)
Lymphs Abs: 1.8 K/uL (ref 0.7–4.0)
MCHC: 31.7 g/dL (ref 30.0–36.0)
MCV: 76.2 fl — ABNORMAL LOW (ref 78.0–100.0)
Monocytes Absolute: 0.5 K/uL (ref 0.1–1.0)
Monocytes Relative: 12.2 % — ABNORMAL HIGH (ref 3.0–12.0)
Neutro Abs: 1.6 K/uL (ref 1.4–7.7)
Neutrophils Relative %: 41.1 % — ABNORMAL LOW (ref 43.0–77.0)
Platelets: 209 K/uL (ref 150.0–400.0)
RBC: 5.58 Mil/uL (ref 4.22–5.81)
RDW: 14.4 % (ref 11.5–15.5)
WBC: 4 K/uL (ref 4.0–10.5)

## 2024-07-21 NOTE — Progress Notes (Unsigned)
 Complete physical exam  Patient: Allen Mays   DOB: 09-01-2001   23 y.o. Male  MRN: [id number]  Subjective:    Chief Complaint  Patient presents with  . Annual Exam    Allen Mays is a 23 y.o. male who presents today for a complete physical exam. He reports consuming a general diet. Exercising: Physical job with no additional exercise.  He generally feels well. He reports sleeping well. He does not have additional problems to discuss today.    Most recent fall risk assessment:    07/21/2024    2:02 PM  Fall Risk   Falls in the past year? 0  Number falls in past yr: 0  Injury with Fall? 0  Risk for fall due to : No Fall Risks  Follow up Falls evaluation completed     Most recent depression screenings:    07/21/2024    2:02 PM 10/22/2022    9:58 AM  PHQ 2/9 Scores  PHQ - 2 Score 0 0  PHQ- 9 Score 0 0    Vision:Within last year and Dental: No current dental problems and Receives regular dental care  Past Medical History:  Diagnosis Date  . Asthma   . Bowel obstruction (HCC)   . Hip fracture [facility])    Past Surgical History:  Procedure Laterality Date  . APPENDECTOMY  2015  . COLON SURGERY  01/16/2022   Exploratory laparotomy with lysis of adhesions   Social History   Tobacco Use  . Smoking status: Never  . Smokeless tobacco: Never  Vaping Use  . Vaping status: Never Used  Substance Use Topics  . Alcohol use: Yes    Comment: Weekends  . Drug use: Yes    Types: Marijuana    Comment: Twice a week   Social History   Socioeconomic History  . Marital status: Single    Spouse name: Not on file  . Number of children: 0  . Years of education: Not on file  . Highest education level: Bachelor's degree (e.g., BA, AB, BS)  Occupational History  . Not on file  Tobacco Use  . Smoking status: Never  . Smokeless tobacco: Never  Vaping Use  . Vaping status: Never Used  Substance and Sexual Activity  . Alcohol use: Yes    Comment:  Weekends  . Drug use: Yes    Types: Marijuana    Comment: Twice a week  . Sexual activity: Yes    Birth control/protection: Condom  Other Topics Concern  . Not on file  Social History Narrative  . Not on file   Social Drivers of Health   Financial Resource Strain: Low Risk  (01/14/2022)   [facility]   Overall Financial Resource Strain (CARDIA)   . Difficulty of Paying Living Expenses: Not very hard  Food Insecurity: No Food Insecurity (07/20/2023)   Hunger Vital Sign   . Worried About Programme Researcher, Broadcasting/film/video in the Last Year: Never true   . Ran Out of Food in the Last Year: Never true  Transportation Needs: No Transportation Needs (01/14/2022)   Received from [facility]   [facility] - Transportation   . Lack of Transportation (Medical): No   . Lack of Transportation (Non-Medical): No  Physical Activity: Sufficiently Active (07/20/2023)   Exercise Vital Sign   . Days of Exercise per Week: 4 days   . Minutes of Exercise per Session: 60 min  Stress: No Stress Concern Present (07/20/2023)  [employer] of Occupational Health - Occupational Stress Questionnaire   . Feeling of Stress : Not at all  Social Connections: Moderately Isolated (07/20/2023)   Social Connection and Isolation Panel   . Frequency of Communication with Friends and Family: More than three times a week   . Frequency of Social Gatherings with Friends and Family: Three times a week   . Attends Religious Services: Never   . Active Member of Clubs or Organizations: Yes   . Attends Banker Meetings: Never   . Marital Status: Never married  Intimate Partner Violence: Not At Risk (07/20/2023)   Humiliation, Afraid, Rape, and Kick questionnaire   . Fear of Current or Ex-Partner: No   . Emotionally Abused: No   . Physically Abused: No   . Sexually Abused: No   Family Status  Relation Name Status  . Mother  Alive  . Father  Deceased  . Sister  Alive  . Sister  Alive  .  Brother  Alive  . Brother  Alive  . MGM  Deceased  . MGF  Deceased  . PGM  Deceased  . PGF  Deceased  No partnership data on file   Family History  Problem Relation Age of Onset  . Healthy Mother   . Other Father        unknown medical history   Allergies  Allergen Reactions  . Justicia Adhatoda (Malabar Nut Tree) [Justicia Adhatoda] Anaphylaxis  . Ibuprofen     GI intolerance  . Tilactase Itching   Patient Care Team: Billy Philippe SAUNDERS, NP as PCP - General (Family Medicine)   Outpatient Medications Prior to Visit  Medication Sig  . EPINEPHrine  (EPIPEN  2-PAK) 0.3 mg/0.3 mL IJ SOAJ injection Inject 0.3 mg into the muscle as needed for anaphylaxis.   No facility-administered medications prior to visit.    Review of Systems  Constitutional: Negative.   HENT: Negative.    Eyes: Negative.   Respiratory: Negative.    Cardiovascular: Negative.   Gastrointestinal:  Positive for abdominal pain, constipation and diarrhea.  Genitourinary: Negative.   Musculoskeletal: Negative.   Skin:  Positive for itching (Mostly in the winter).  Neurological: Negative.   Endo/Heme/Allergies: Negative.   Psychiatric/Behavioral: Negative.     See HPI above    Objective:   BP 122/78   Pulse 85   Temp 98.3 F (36.8 C) (Oral)   Ht 5' 11 (1.803 m)   Wt 174 lb (78.9 kg)   SpO2 98%   BMI 24.27 kg/m  {Vitals History (Optional):23777}  Physical Exam   No results found for any visits on 07/21/24. {Show previous labs (optional):23779}    Assessment & Plan:    Routine Health Maintenance and Physical Exam  Immunization History  Administered Date(s) Administered  . DTaP 07/16/2000, 09/10/2000, 11/20/2000, 10/22/2001, 06/07/2004  . HIB (PRP-OMP) 07/16/2000, 09/10/2000, 11/20/2000, 10/22/2001  . Hepatitis A, Ped/Adol-2 Dose 05/13/2006, 10/02/2008  . Hepatitis B, PED/ADOLESCENT 2000-07-31, 06/17/2000, 03/05/2001  . Influenza, Seasonal, Injecte, Preservative Fre 07/20/2023  .  Influenza-Unspecified 06/12/2009, 07/05/2013, 06/05/2015, 06/04/2016  . MMR 05/21/2001, 06/07/2004  . Meningococcal Conjugate 05/21/2011  . Moderna Sars-Covid-2 Vaccination 03/06/2020, 04/03/2020, 10/08/2020  . Novel Infuenza-h1n1-09 09/12/2008  . Pneumococcal Conjugate,unspecified 07/16/2000, 04/08/2002  . Polio, Unspecified 07/16/2000, 09/10/2000, 03/05/2001, 06/07/2004  . Td 05/21/2011  . Varicella 05/21/2001, 04/02/2012    Health Maintenance  Topic Date Due  . HPV VACCINES (1 - Male 3-dose series) Never done  . Pneumococcal Vaccine (1 of 2 - PCV) 05/18/2019  .  DTaP/Tdap/Td (7 - Tdap) 05/20/2021  . Influenza Vaccine  04/22/2024  . COVID-19 Vaccine (4 - 2025-26 season) 05/23/2024  . Hepatitis B Vaccines 19-59 Average Risk  Completed  . Hepatitis C Screening  Completed  . HIV Screening  Completed  . Meningococcal B Vaccine  Aged Out    Discussed health benefits of physical activity, and encouraged him to engage in regular exercise appropriate for his age and condition.  Annual physical exam  1.Review health maintenance: -Covid booster: Declines  -Tdap vaccine: May obtain at local pharmacy  -HPV vaccine: Not had  -Influenza vaccine: Administered  -PNA vaccine: May obtain at local pharmacy  No follow-ups on file.     Bernard Slayden, NP

## 2024-07-22 ENCOUNTER — Ambulatory Visit: Payer: Self-pay | Admitting: Family Medicine

## 2024-07-22 DIAGNOSIS — R718 Other abnormality of red blood cells: Secondary | ICD-10-CM

## 2024-07-22 DIAGNOSIS — R17 Unspecified jaundice: Secondary | ICD-10-CM

## 2024-07-22 LAB — TSH: TSH: 0.62 u[IU]/mL (ref 0.35–5.50)

## 2024-07-22 NOTE — Patient Instructions (Signed)
-  It was great to see you. -Physical exam completed.  -Influenza vaccine administered. -May obtain Tdap (tetanus) vaccine, HPV vaccine, and pneumonia vaccine at your local pharmacy. -Ordered labs (CBC, CMP, TSH) based on annual physical exam. Office will call with lab results and will be available via MyChart. -Follow up in 1 year for a physical.

## 2024-08-22 ENCOUNTER — Other Ambulatory Visit: Payer: Self-pay

## 2024-09-06 ENCOUNTER — Ambulatory Visit
Admission: EM | Admit: 2024-09-06 | Discharge: 2024-09-06 | Disposition: A | Discharge status: Home / Self Care | Payer: Self-pay | Attending: Family Medicine | Admitting: Family Medicine

## 2024-09-06 DIAGNOSIS — R300 Dysuria: Secondary | ICD-10-CM

## 2024-09-06 DIAGNOSIS — N342 Other urethritis: Secondary | ICD-10-CM | POA: Insufficient documentation

## 2024-09-06 DIAGNOSIS — Z113 Encounter for screening for infections with a predominantly sexual mode of transmission: Secondary | ICD-10-CM

## 2024-09-06 LAB — POCT URINE DIPSTICK
Bilirubin, UA: NEGATIVE
Glucose, UA: NEGATIVE mg/dL
Ketones, POC UA: NEGATIVE mg/dL
Nitrite, UA: NEGATIVE
Protein Ur, POC: NEGATIVE mg/dL
Spec Grav, UA: 1.015 (ref 1.010–1.025)
Urobilinogen, UA: 0.2 U/dL
pH, UA: 7 (ref 5.0–8.0)

## 2024-09-06 MED ORDER — CEFTRIAXONE SODIUM 500 MG IJ SOLR
500.0000 mg | INTRAMUSCULAR | Status: DC
  Administered 2024-09-06: 14:00:00 500 mg via INTRAMUSCULAR

## 2024-09-06 MED ORDER — DOXYCYCLINE HYCLATE 100 MG PO CAPS: 100.0000 mg | ORAL_CAPSULE | Freq: Two times a day (BID) | ORAL | 0 refills | Status: DC

## 2024-09-06 NOTE — ED Triage Notes (Addendum)
 Patient noticed painful urination and blood in urine last night. Patient reports slight irritation with urination today. Denies any hematuria today.  Patient reports being sexually active - uses condoms most of the time - patient does not feel that he has been exposed to any STDs - patient would like to be tested. 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

## 2024-09-06 NOTE — ED Provider Notes (Signed)
 [facility]  Note:  This document was prepared using Conservation officer, historic buildings and may include unintentional dictation errors.  MRN: [id number] DOB: 09/05/2001  Subjective:   Allen Mays is a 23 y.o. male presenting for 1 day history of dysuria, hematuria. Has previously had intermittent issues with this since he had a surgery ~2 years ago. He does have unprotected sex however and would like to be treated for STIs. Wants complete testing. Denies hematuria, urinary frequency, penile discharge, penile swelling, testicular pain, testicular swelling, anal pain, groin pain.   Current Outpatient Medications  Medication Instructions   EPINEPHrine  (EPIPEN  2-PAK) 0.3 mg, Intramuscular, As needed    Allergies[1]  Past Medical History:  Diagnosis Date   Asthma    Bowel obstruction (HCC)    Hip fracture (HCC)      Past Surgical History:  Procedure Laterality Date   APPENDECTOMY  2015   COLON SURGERY  01/16/2022   Exploratory laparotomy with lysis of adhesions    Family History  Problem Relation Age of Onset   Healthy Mother    Other Father        unknown medical history    Social History   Occupational History   Not on file  Tobacco Use   Smoking status: Never   Smokeless tobacco: Never  Vaping Use   Vaping status: Never Used  Substance and Sexual Activity   Alcohol use: Yes    Comment: monthly   Drug use: Yes    Types: Marijuana    Comment: Twice a week   Sexual activity: Yes    Birth control/protection: Condom     ROS   Objective:   Vitals: BP 136/81 (BP Location: Right Arm)   Pulse 67   Temp (!) 97.4 F (36.3 C) (Oral)   Resp 18   SpO2 98%   Physical Exam Constitutional:      General: He is not in acute distress.    Appearance: Normal appearance. He is well-developed and normal weight. He is not ill-appearing, toxic-appearing or diaphoretic.  HENT:     Head: Normocephalic and atraumatic.     Right Ear:  External ear normal.     Left Ear: External ear normal.     Nose: Nose normal.     Mouth/Throat:     Pharynx: Oropharynx is clear.  Eyes:     General: No scleral icterus.       Right eye: No discharge.        Left eye: No discharge.     Extraocular Movements: Extraocular movements intact.  Cardiovascular:     Rate and Rhythm: Normal rate.  Pulmonary:     Effort: Pulmonary effort is normal.  Genitourinary:    Penis: Circumcised. Discharge present. No phimosis, paraphimosis, hypospadias, erythema, tenderness, swelling or lesions.   Musculoskeletal:     Cervical back: Normal range of motion.  Neurological:     Mental Status: He is alert and oriented to person, place, and time.  Psychiatric:        Mood and Affect: Mood normal.        Behavior: Behavior normal.        Thought Content: Thought content normal.        Judgment: Judgment normal.    IM ceftriaxone  500mg  administered in clinic.   Assessment and Plan :   PDMP not reviewed this encounter.  1. Urethritis   2. Dysuria   3. Routine screening for STI (sexually transmitted infection)  Patient treated empirically as per CDC guidelines with IM ceftriaxone , doxycycline  as an outpatient.  Labs pending.   Counseled on safe sex practices including abstaining for 1 week following treatment.  Counseled patient on potential for adverse effects with medications prescribed/recommended today, ER and return-to-clinic precautions discussed, patient verbalized understanding.     [1]  Allergies Allergen Reactions   Justicia Adhatoda (Malabar Nut Tree) [Justicia Adhatoda] Anaphylaxis   Ibuprofen     GI intolerance     Christopher Savannah, PA-C 09/06/24 1411

## 2024-09-06 NOTE — Discharge Instructions (Addendum)
 You received an injection of Rocefin and a prescription for Vibramycin  to address suspected STI infection given your symptoms. These are the recommendations from the CDC. Will update your treatment plan based off of your results otherwise. Follow up with your PCP to see if you end up needing a referral to urology.

## 2024-09-07 ENCOUNTER — Ambulatory Visit (HOSPITAL_COMMUNITY): Payer: Self-pay

## 2024-09-07 LAB — HIV ANTIBODY (ROUTINE TESTING W REFLEX): HIV Screen 4th Generation wRfx: NONREACTIVE

## 2024-09-07 LAB — CYTOLOGY, (ORAL, ANAL, URETHRAL) ANCILLARY ONLY
Chlamydia: POSITIVE — AB
Comment: NEGATIVE
Comment: NEGATIVE
Comment: NORMAL
Neisseria Gonorrhea: NEGATIVE
Trichomonas: NEGATIVE

## 2024-09-07 LAB — SYPHILIS: RPR W/REFLEX TO RPR TITER AND TREPONEMAL ANTIBODIES, TRADITIONAL SCREENING AND DIAGNOSIS ALGORITHM: RPR Ser Ql: NONREACTIVE

## 2024-09-08 LAB — URINE CULTURE: Culture: NO GROWTH

## 2024-09-24 ENCOUNTER — Ambulatory Visit
Admission: EM | Admit: 2024-09-24 | Discharge: 2024-09-24 | Disposition: A | Discharge status: Home / Self Care | Attending: Family Medicine | Admitting: Family Medicine

## 2024-09-24 DIAGNOSIS — Z113 Encounter for screening for infections with a predominantly sexual mode of transmission: Secondary | ICD-10-CM | POA: Diagnosis not present

## 2024-09-24 NOTE — ED Provider Notes (Signed)
 " [facility]  Note:  This document was prepared using Conservation officer, historic buildings and may include unintentional dictation errors.  MRN: [id number] DOB: [birth year]  Subjective:   Allen Mays is a 24 y.o. male presenting for recheck of STIs.  Patient underwent treatment for chlamydia infection from his visit on 09/06/2024.  First dose he vomited and another dose he missed.  He has felt normal for the most part but still has intermittent urinary tingling.  Denies dysuria, hematuria, urinary frequency, penile discharge, penile swelling, testicular pain, testicular swelling, anal pain, groin pain.  Does not hydrate very well, drinks about 1-2 bottles of water daily.  Drinks juices or tea otherwise.  Current Outpatient Medications  Medication Instructions   doxycycline  (VIBRAMYCIN ) 100 mg, Oral, 2 times daily   EPINEPHrine  (EPIPEN  2-PAK) 0.3 mg, Intramuscular, As needed    Allergies[1]  Past Medical History:  Diagnosis Date   Asthma    Bowel obstruction (HCC)    Hip fracture (HCC)      Past Surgical History:  Procedure Laterality Date   APPENDECTOMY  2015   COLON SURGERY  01/16/2022   Exploratory laparotomy with lysis of adhesions    Family History  Problem Relation Age of Onset   Healthy Mother    Other Father        unknown medical history    Social History   Occupational History   Not on file  Tobacco Use   Smoking status: Never   Smokeless tobacco: Never  Vaping Use   Vaping status: Never Used  Substance and Sexual Activity   Alcohol use: Yes    Comment: monthly   Drug use: Yes    Types: Marijuana    Comment: Twice a week   Sexual activity: Not Currently    Birth control/protection: Condom     ROS   Objective:   Vitals: BP 138/82 (BP Location: Left Arm)   Pulse 66   Temp 97.9 F (36.6 C) (Oral)   Resp 16   SpO2 96%   Physical Exam Constitutional:      General: He is not in acute distress.    Appearance:  Normal appearance. He is well-developed and normal weight. He is not ill-appearing, toxic-appearing or diaphoretic.  HENT:     Head: Normocephalic and atraumatic.     Right Ear: External ear normal.     Left Ear: External ear normal.     Nose: Nose normal.     Mouth/Throat:     Pharynx: Oropharynx is clear.  Eyes:     General: No scleral icterus.       Right eye: No discharge.        Left eye: No discharge.     Extraocular Movements: Extraocular movements intact.  Cardiovascular:     Rate and Rhythm: Normal rate.  Pulmonary:     Effort: Pulmonary effort is normal.  Musculoskeletal:     Cervical back: Normal range of motion.  Neurological:     Mental Status: He is alert and oriented to person, place, and time.  Psychiatric:        Mood and Affect: Mood normal.        Behavior: Behavior normal.        Thought Content: Thought content normal.        Judgment: Judgment normal.     Assessment and Plan :   PDMP not reviewed this encounter.  1. Screening examination for STI  Recommended hydrating more consistently and avoidance of urinary irritants.  Cytology pending, will treat as appropriate based off of results.  Counseled patient on potential for adverse effects with medications prescribed/recommended today, ER and return-to-clinic precautions discussed, patient verbalized understanding.     [1]  Allergies Allergen Reactions   Justicia Adhatoda (Malabar Nut Tree) [Justicia Adhatoda] Anaphylaxis   Other Anaphylaxis   Ibuprofen     GI intolerance     Christopher Savannah, PA-C 09/24/24 1354  "

## 2024-09-24 NOTE — Discharge Instructions (Signed)
 Your test results should be available tomorrow.  Our results team will let you know if you need any kind of treatment for any positive test results.  Make sure you hydrate very well with plain water and a quantity of 80 ounces of water a day.  Please limit drinks that are considered urinary irritants such as fruit juices, soda, sweet tea, coffee, artifical sweetened drinks, energy drinks, alcohol.  These can worsen your urinary and genital symptoms but also be the source of them.

## 2024-09-24 NOTE — ED Triage Notes (Signed)
 Pt requested STD's test as he tested positive fro chlamydia 2 weeks ago and he missed 1 11/2 day of treatment as he went for a trip and also vomited the 1 st dose. Pt reports his testicles till feels heavy.

## 2024-09-26 LAB — CYTOLOGY, (ORAL, ANAL, URETHRAL) ANCILLARY ONLY
Chlamydia: POSITIVE — AB
Comment: NEGATIVE
Comment: NEGATIVE
Comment: NORMAL
Neisseria Gonorrhea: NEGATIVE
Trichomonas: NEGATIVE

## 2024-09-27 ENCOUNTER — Ambulatory Visit (HOSPITAL_COMMUNITY): Payer: Self-pay

## 2024-09-28 MED ORDER — DOXYCYCLINE HYCLATE 100 MG PO CAPS: 100.0000 mg | ORAL_CAPSULE | Freq: Two times a day (BID) | ORAL | 0 refills | Status: AC

## 2025-07-24 ENCOUNTER — Encounter: Admitting: Family Medicine
